# Patient Record
Sex: Male | Born: 1975 | Race: White | Hispanic: No | Smoking: Current every day smoker
Health system: Southern US, Community
[De-identification: ages and names within clinical notes are randomized; demographics above are authoritative.]

## PROBLEM LIST (undated history)

## (undated) ENCOUNTER — Emergency Department (HOSPITAL_COMMUNITY): Admission: EM | Payer: Self-pay | Source: Home / Self Care

## (undated) DIAGNOSIS — B1920 Unspecified viral hepatitis C without hepatic coma: Secondary | ICD-10-CM

## (undated) DIAGNOSIS — I219 Acute myocardial infarction, unspecified: Secondary | ICD-10-CM

## (undated) DIAGNOSIS — N200 Calculus of kidney: Secondary | ICD-10-CM

## (undated) DIAGNOSIS — I421 Obstructive hypertrophic cardiomyopathy: Secondary | ICD-10-CM

## (undated) DIAGNOSIS — N183 Chronic kidney disease, stage 3 unspecified: Secondary | ICD-10-CM

## (undated) DIAGNOSIS — I7101 Dissection of ascending aorta: Secondary | ICD-10-CM

## (undated) DIAGNOSIS — K5010 Crohn's disease of large intestine without complications: Secondary | ICD-10-CM

## (undated) DIAGNOSIS — I10 Essential (primary) hypertension: Secondary | ICD-10-CM

## (undated) DIAGNOSIS — F1410 Cocaine abuse, uncomplicated: Secondary | ICD-10-CM

## (undated) HISTORY — PX: SMALL INTESTINE SURGERY: SHX150

## (undated) HISTORY — PX: LITHOTRIPSY: SUR834

---

## 1998-09-15 HISTORY — PX: RENAL BIOPSY: SHX156

## 2006-03-14 ENCOUNTER — Inpatient Hospital Stay (HOSPITAL_COMMUNITY): Admission: EM | Admit: 2006-03-14 | Discharge: 2006-03-24 | Payer: Self-pay | Admitting: *Deleted

## 2009-09-15 HISTORY — PX: OTHER SURGICAL HISTORY: SHX169

## 2009-09-15 HISTORY — PX: CORONARY ANGIOPLASTY WITH STENT PLACEMENT: SHX49

## 2009-09-15 HISTORY — PX: CARDIAC CATHETERIZATION: SHX172

## 2012-01-17 ENCOUNTER — Emergency Department (HOSPITAL_COMMUNITY): Payer: Self-pay

## 2012-01-17 ENCOUNTER — Inpatient Hospital Stay (HOSPITAL_COMMUNITY)
Admission: EM | Admit: 2012-01-17 | Discharge: 2012-01-18 | DRG: 392 | Discharge status: Against Medical Advice | Payer: Self-pay | Attending: Family Medicine | Admitting: Family Medicine

## 2012-01-17 ENCOUNTER — Encounter (HOSPITAL_COMMUNITY): Payer: Self-pay | Admitting: Emergency Medicine

## 2012-01-17 DIAGNOSIS — R197 Diarrhea, unspecified: Secondary | ICD-10-CM | POA: Diagnosis present

## 2012-01-17 DIAGNOSIS — I10 Essential (primary) hypertension: Secondary | ICD-10-CM

## 2012-01-17 DIAGNOSIS — R1011 Right upper quadrant pain: Principal | ICD-10-CM | POA: Diagnosis present

## 2012-01-17 DIAGNOSIS — R109 Unspecified abdominal pain: Secondary | ICD-10-CM

## 2012-01-17 DIAGNOSIS — I2510 Atherosclerotic heart disease of native coronary artery without angina pectoris: Secondary | ICD-10-CM | POA: Insufficient documentation

## 2012-01-17 DIAGNOSIS — B86 Scabies: Secondary | ICD-10-CM | POA: Diagnosis present

## 2012-01-17 DIAGNOSIS — K5010 Crohn's disease of large intestine without complications: Secondary | ICD-10-CM | POA: Diagnosis present

## 2012-01-17 DIAGNOSIS — F1410 Cocaine abuse, uncomplicated: Secondary | ICD-10-CM

## 2012-01-17 DIAGNOSIS — I252 Old myocardial infarction: Secondary | ICD-10-CM

## 2012-01-17 DIAGNOSIS — A09 Infectious gastroenteritis and colitis, unspecified: Secondary | ICD-10-CM

## 2012-01-17 DIAGNOSIS — N19 Unspecified kidney failure: Secondary | ICD-10-CM

## 2012-01-17 DIAGNOSIS — R319 Hematuria, unspecified: Secondary | ICD-10-CM | POA: Diagnosis present

## 2012-01-17 DIAGNOSIS — F17200 Nicotine dependence, unspecified, uncomplicated: Secondary | ICD-10-CM | POA: Diagnosis present

## 2012-01-17 HISTORY — DX: Essential (primary) hypertension: I10

## 2012-01-17 HISTORY — DX: Acute myocardial infarction, unspecified: I21.9

## 2012-01-17 HISTORY — DX: Cocaine abuse, uncomplicated: F14.10

## 2012-01-17 HISTORY — DX: Crohn's disease of large intestine without complications: K50.10

## 2012-01-17 HISTORY — DX: Calculus of kidney: N20.0

## 2012-01-17 LAB — POCT I-STAT, CHEM 8
BUN: 29 mg/dL — ABNORMAL HIGH (ref 6–23)
Calcium, Ion: 1.2 mmol/L (ref 1.12–1.32)
Chloride: 111 mEq/L (ref 96–112)
Creatinine, Ser: 2.1 mg/dL — ABNORMAL HIGH (ref 0.50–1.35)
Glucose, Bld: 65 mg/dL — ABNORMAL LOW (ref 70–99)
HCT: 39 % (ref 39.0–52.0)
Hemoglobin: 13.3 g/dL (ref 13.0–17.0)
Potassium: 4.2 mEq/L (ref 3.5–5.1)
Sodium: 142 mEq/L (ref 135–145)
TCO2: 20 mmol/L (ref 0–100)

## 2012-01-17 LAB — URINALYSIS, ROUTINE W REFLEX MICROSCOPIC
Bilirubin Urine: NEGATIVE
Glucose, UA: NEGATIVE mg/dL
Ketones, ur: NEGATIVE mg/dL
Leukocytes, UA: NEGATIVE
Nitrite: NEGATIVE
Protein, ur: 30 mg/dL — AB
Specific Gravity, Urine: 1.022 (ref 1.005–1.030)
Urobilinogen, UA: 1 mg/dL (ref 0.0–1.0)
pH: 5.5 (ref 5.0–8.0)

## 2012-01-17 LAB — CBC
HCT: 38.7 % — ABNORMAL LOW (ref 39.0–52.0)
Hemoglobin: 12.6 g/dL — ABNORMAL LOW (ref 13.0–17.0)
MCH: 26.1 pg (ref 26.0–34.0)
MCHC: 32.6 g/dL (ref 30.0–36.0)
MCV: 80.3 fL (ref 78.0–100.0)
Platelets: 170 10*3/uL (ref 150–400)
RBC: 4.82 MIL/uL (ref 4.22–5.81)
RDW: 18.4 % — ABNORMAL HIGH (ref 11.5–15.5)
WBC: 6.1 10*3/uL (ref 4.0–10.5)

## 2012-01-17 LAB — URINE MICROSCOPIC-ADD ON

## 2012-01-17 MED ORDER — SODIUM CHLORIDE 0.9 % IV BOLUS (SEPSIS)
1000.0000 mL | Freq: Once | INTRAVENOUS | Status: AC
  Administered 2012-01-17: 1000 mL via INTRAVENOUS

## 2012-01-17 MED ORDER — KETOROLAC TROMETHAMINE 15 MG/ML IJ SOLN
15.0000 mg | INTRAMUSCULAR | Status: AC
  Administered 2012-01-17: 15 mg via INTRAVENOUS
  Filled 2012-01-17: qty 1

## 2012-01-17 MED ORDER — HYDROMORPHONE HCL PF 1 MG/ML IJ SOLN
1.0000 mg | Freq: Once | INTRAMUSCULAR | Status: AC
  Administered 2012-01-17: 1 mg via INTRAVENOUS
  Filled 2012-01-17: qty 1

## 2012-01-17 MED ORDER — ONDANSETRON HCL 4 MG/2ML IJ SOLN
4.0000 mg | Freq: Once | INTRAMUSCULAR | Status: AC
  Administered 2012-01-17: 4 mg via INTRAVENOUS
  Filled 2012-01-17: qty 2

## 2012-01-17 NOTE — ED Notes (Addendum)
Patient complaining of nausea and right lower abdominal pain since this morning; pain stretches around right side and to right flank area.  Patient reports history of kidney stones (last incident two years ago); patient states that these symptoms feel the same.  Patient reports burning during urination; denies hematuria.

## 2012-01-17 NOTE — ED Provider Notes (Signed)
History    36 year old male with abdominal pain. Gradual onset this morning. Patient Blake Gonzalez on the couch during onset. Pain is in the right flank and right lower quadrant. Radiation to his back. Worse with movement. Somewhat better with tensing of his abdominal muscles. No urinary complaints. No fevers or chills. Mild nausea. No vomiting. Patient reports a history kidney stones. Last one was many years ago he's not sure if current symptoms feel similar. CSN: [id number]  Arrival date & time 01/17/12  2027   First MD Initiated Contact with Patient 01/17/12 2044      Chief Complaint  Patient presents with  . Abdominal Pain    (Consider location/radiation/quality/duration/timing/severity/associated sxs/prior treatment) HPI  Past Medical History  Diagnosis Date  . Hypertension   . Kidney stones   . Myocardial infarction     Past Surgical History  Procedure Date  . Lithotripsy   . Cardiac catheterization   . Coronary stent placement     History reviewed. No pertinent family history.  History  Substance Use Topics  . Smoking status: Current Everyday Smoker -- 0.5 packs/day  . Smokeless tobacco: Not on file  . Alcohol Use: No      Review of Systems   Review of symptoms negative unless otherwise noted in HPI.   Allergies  Review of patient's allergies indicates no known allergies.  Home Medications   Current Outpatient Rx  Name Route Sig Dispense Refill  . ASPIRIN 325 MG PO TABS Oral Take 325 mg by mouth daily.    Marland Kitchen CLONIDINE HCL 0.1 MG PO TABS Oral Take 0.1 mg by mouth 2 (two) times daily.    . FUROSEMIDE 20 MG PO TABS Oral Take 20 mg by mouth daily.    Marland Kitchen LISINOPRIL 20 MG PO TABS Oral Take 20 mg by mouth daily.    Marland Kitchen PRESCRIPTION MEDICATION Oral Take 1 tablet by mouth 2 (two) times daily. Patient stated he takes Caduet 5mg . He was unsure of the atorvastatin strength      BP 170/115  Pulse 90  Temp(Src) 98.3 F (36.8 C) (Oral)  Resp 20  SpO2 97%  Physical  Exam  Nursing note and vitals reviewed. Constitutional: He appears well-developed and well-nourished. No distress.  HENT:  Head: Normocephalic and atraumatic.  Eyes: Conjunctivae are normal. Right eye exhibits no discharge. Left eye exhibits no discharge.  Neck: Neck supple.  Cardiovascular: Normal rate, regular rhythm and normal heart sounds.  Exam reveals no gallop and no friction rub.   No murmur heard. Pulmonary/Chest: Effort normal and breath sounds normal. No respiratory distress.  Abdominal: Soft. He exhibits no distension and no mass. There is tenderness. There is no rebound and no guarding.       Well healed midline surgical scar. Mild R sided abdominal tenderness.  Musculoskeletal: He exhibits no edema and no tenderness.       R cva tenderness  Neurological: He is alert.  Skin: Skin is warm and dry. He is not diaphoretic.  Psychiatric: He has a normal mood and affect. His behavior is normal. Thought content normal.    ED Course  Procedures (including critical care time)  Labs Reviewed  URINALYSIS, ROUTINE W REFLEX MICROSCOPIC - Abnormal; Notable for the following:    Hgb urine dipstick LARGE (*)    Protein, ur 30 (*)    All other components within normal limits  POCT I-STAT, CHEM 8 - Abnormal; Notable for the following:    BUN 29 (*)    Creatinine,  Ser 2.10 (*)    Glucose, Bld 65 (*)    All other components within normal limits  URINE MICROSCOPIC-ADD ON - Abnormal; Notable for the following:    Bacteria, UA FEW (*)    All other components within normal limits  CBC - Abnormal; Notable for the following:    Hemoglobin 12.6 (*)    HCT 38.7 (*)    RDW 18.4 (*)    All other components within normal limits   Ct Abdomen Pelvis Wo Contrast  01/17/2012  *RADIOLOGY REPORT*  Clinical Data: 36 year old male with right-side abdominal pain. History of small bowel resection.  CT ABDOMEN AND PELVIS WITHOUT CONTRAST  Technique:  Multidetector CT imaging of the abdomen and pelvis was  performed following the standard protocol without intravenous contrast.  Comparison: 03/14/2006.  Findings: Cardiomegaly.  No pleural effusion.  There are multiple lower lobe pulmonary nodules, more numerous on the right.  These range in size from 4-9 mm.  None are clearly identified on the comparison.  Stable visualized osseous structures.  A small volume of pelvic free fluid.  No dilated bowel in the abdomen, but individual loops are not well delineated, this appears in part related to a degree of generalized mesenteric haziness. The bladder appears completely decompressed.  Postoperative changes at the cecum.  Probable neo-terminal ileum (series 2 image 59).  Stable chronic splenomegaly.  No abnormality of the pancreas or adrenal glands evident in the absence of contrast.  An exophytic left renal lesion described in 2007 with some atypical features now shows dystrophic type calcification (series 2 image 37.  The noncontrast left kidney otherwise appears stable within normal limits.  The right kidney appears stable within normal limits.  No retroperitoneal lymphadenopathy is evident.  Interestingly, small low density area in the posterior right hepatic lobe on the prior exam also now with calcified (series 2 image 17).  Elsewhere the noncontrast liver parenchyma is within normal limits.  Gallbladder decompressed.  Abdominal free fluid.  IMPRESSION: 1.  New suspicious lung nodules at both lung bases, more numerous on the right.  These are not typical of acute inflammation.  They might be post inflammatory, but none were present in 2007.  In conjunction with #2, early pulmonary metastatic disease cannot be excluded. 2.  Interval dystrophic calcification of the exophytic left renal lesion which demonstrated a typical features in 2007.  A small right hepatic lesion present at that time also has calcified.  Was the patient treated for a malignancy? 3.  No dilated bowel.  Postoperative changes in the right lower  quadrant.  Small volume of nonspecific pelvic free fluid. 4. Cardiomegaly.  Original Report Authenticated By: Harley Hallmark, M.D.     1. Renal failure   2. Abdominal pain    MDM  35yM with abdominal pain. Renal failure. Mentioned in ER note from 2007 but not clear if acute, if resolved, or what labs values were then. Pt on loop diuretic and ACEI which he reports compliance with. Apparently gets filled on occasional ER visits. Symptoms consistent with ureteral colic and UA with hematuria but no stone on CT. Small amount of free fluid in pelvis of uncertain origin. Symptoms possibly from inflammatory bowel disease which pt apparently has history of. Pt told me ulcerative colitis but PMHx lists Crohn's. CT incidentally with multiple pulmonary nodules. Pt smoker. Other findings concerning for possible malignancy. Pt with no reliable follow-up. Given renal failure of unclear chronicity, no follow-up and CT findings, will admit for further eval.  Raeford Razor, MD 01/18/12 [phone]

## 2012-01-17 NOTE — H&P (Signed)
Family Medicine Teaching Service HISTORY & PHYSICAL   Patient name: Blake Gonzalez Medical record number: [id number] Date of birth: 1976/04/12 Age: 36 y.o. Gender: male  Primary Care Provider: Unassigned   Chief Complaint: Abdominal Pain    HPI  Blake Gonzalez is a 36 y.o. year old male presenting with 1 day history of abdominal pain. Location  R LQ abdominal pain  Onset  today  Character  sharp; radiates to back and mid epigastrium;  Severity  8/10  Alleviating  pressure on the belly; tylenol this AM; 1/2 Lortab  Aggrivating  Moving   Associated with Diarrhea, chills  Pt with scattered medical care over the past with care received in 6-7 states.   ROS   Constitutional No wt loss; good appetite;  Infectious No fevers, + Chills, starting today  Resp No cough, no congestion  Cardiac Hx of MI; but no chest pain or palpitations  GI No nausea or vomiting;   GU Burning while urinating, no blood in urine, no hesistancy  Skin Rash, that itches, on feet; used calamine, benadryl, cortisone cream  Neuro Feet feeling numb  No history of asbestos, silca or other chemical exposure history            HISTORY:  PMHx:  Past Medical History  Diagnosis Date  . Hypertension     Multiple Hospitalizations  . Kidney stones   . Myocardial infarction     In [place], [place]  . Crohn's colitis   . Cocaine abuse     Started using after divorce; no use around time of MI    PSHx: Past Surgical History  Procedure Date  . Lithotripsy   . Cardiac catheterization 2011  . Coronary angioplasty with stent placement 2011  . Small intestine surgery   . Renal biopsy 2000  . Other surgical history 2011    Percutaneous biliary tube    Social Hx: History   Social History  . Marital Status: Single    Spouse Name: N/A    Number of Children: N/A  . Years of Education: N/A   Social History Main Topics  . Smoking status: Current Everyday Smoker -- 0.5 packs/day    Types: Cigarettes  . Smokeless  tobacco: None  . Alcohol Use: No  . Drug Use: No     Hx of Cocaine Abuse; Clean 6-7 months  . Sexually Active: None   Other Topics Concern  . None   Social History Narrative   DivorcedLives with Girlfriend in [place] and [place] yo and 2 yo Sons    Substance Hx: History  Substance Use Topics  . Smoking status: Current Everyday Smoker -- 0.5 packs/day    Types: Cigarettes  . Smokeless tobacco: Not on file  . Alcohol Use: No    Family Hx: Family History  Problem Relation Age of Onset  . Heart disease Father     early death at age 64  . Diabetes Maternal Aunt   . Cancer      Allergies: No Known Allergies  Home Medications: Prior to Admission medications   Medication Sig Start Date End Date Taking? Authorizing Provider  aspirin 325 MG tablet Take 325 mg by mouth daily.   Yes Historical Provider, MD  cloNIDine (CATAPRES) 0.1 MG tablet Take 0.1 mg by mouth 2 (two) times daily.   Yes Historical Provider, MD  furosemide (LASIX) 20 MG tablet Take 20 mg by mouth daily.   Yes Historical Provider, MD  lisinopril (PRINIVIL,ZESTRIL) 20 MG tablet Take  20 mg by mouth daily.   Yes Historical Provider, MD  PRESCRIPTION MEDICATION Take 1 tablet by mouth 2 (two) times daily. Patient stated he takes Caduet 5mg . He was unsure of the atorvastatin strength   Yes Historical Provider, MD              OBJECTIVE  Vitals: Patient Vitals for the past 24 hrs:  BP Temp Temp src Pulse Resp SpO2  01/17/12 2350 164/92 mmHg 97 F (36.1 C) Oral 66  18  97 %  01/17/12 2032 170/115 mmHg 98.3 F (36.8 C) Oral 90  20  97 %   Wt Readings from Last 3 Encounters:  No data found for Wt   No intake or output data in the 24 hours ending 01/18/12 0015  PE: GENERAL:  Adult Caucasian male.  Examined in [facility] ED.  Laying in hospital stretcher  In moderate discomfort; norespiratory distress.   PSYCH: Alert and appropriately interactive; Insight:Good   H&N: NECK: supple, no adenopathy, trachea  midline and no carotid bruits ENT+Mouth: normal TM's and external ear canals both earsabnormal findings: dentition: multiple carries and periodontitis THORAX: HEART: RRR, S1/S2 heard, no murmur LUNGS: CTA B, no wheezes, no crackles ABDOMEN:  +hyperactive BS, soft, tenderness in RLQ; no rigidity, voluntary guarding; no rebound, no masses/organomegaly EXTREMITIES: Moves all 4 extremities spontaneously, warm well perfused, no edema, bilateral DP and PT pulses 3+/4.   SKIN:  Multiple areas of red excoriated lesions mainly located over the feet and ankles but also on back, arms and legs. Scattered on back  LABS:  Basename 01/17/12 2205 01/17/12 2116  WBC 6.1 --  HGB 12.6* 13.3  HCT 38.7* 39.0  PLT 170 --    Basename 01/17/12 2116  NA 142  K 4.2  CL 111  CO2 --  BUN 29*  CREATININE 2.10*  CALCIUM --  GLUCOSE 65*    Basename 01/17/12 2205  RDW 18.4*  MCV 80.3  MCHC 32.6  MRET --    Basename 01/17/12 2116  PHART --  PCO2ART --  PO2ART --  HCO3 --  TCO2 20  ACIDBASEDEF --  O2SAT --    Basename 01/17/12 2116  PTH --  CAION 1.20    MICRO: Urinalysis  Basename 01/17/12 2050  COLORURINE YELLOW  APPEARANCEUR CLEAR  LABSPEC 1.022  PHURINE 5.5  GLUCOSEU NEGATIVE  HGBUR LARGE*  BILIRUBINUR NEGATIVE  KETONESUR NEGATIVE  PROTEINUR 30*  UROBILINOGEN 1.0  NITRITE NEGATIVE  LEUKOCYTESUR NEGATIVE   IMAGING: Ct Abdomen Pelvis Wo Contrast  01/17/2012  *RADIOLOGY REPORT*  Clinical Data: 36 year old male with right-side abdominal pain. History of small bowel resection.  CT ABDOMEN AND PELVIS WITHOUT CONTRAST  Technique:  Multidetector CT imaging of the abdomen and pelvis was performed following the standard protocol without intravenous contrast.  Comparison: 03/14/2006.  Findings: Cardiomegaly.  No pleural effusion.  There are multiple lower lobe pulmonary nodules, more numerous on the right.  These range in size from 4-9 mm.  None are clearly identified on the comparison.   Stable visualized osseous structures.  A small volume of pelvic free fluid.  No dilated bowel in the abdomen, but individual loops are not well delineated, this appears in part related to a degree of generalized mesenteric haziness. The bladder appears completely decompressed.  Postoperative changes at the cecum.  Probable neo-terminal ileum (series 2 image 59).  Stable chronic splenomegaly.  No abnormality of the pancreas or adrenal glands evident in the absence of contrast.  An exophytic left renal lesion  described in 2007 with some atypical features now shows dystrophic type calcification (series 2 image 37.  The noncontrast left kidney otherwise appears stable within normal limits.  The right kidney appears stable within normal limits.  No retroperitoneal lymphadenopathy is evident.  Interestingly, small low density area in the posterior right hepatic lobe on the prior exam also now with calcified (series 2 image 17).  Elsewhere the noncontrast liver parenchyma is within normal limits.  Gallbladder decompressed.  Abdominal free fluid.  IMPRESSION: 1.  New suspicious lung nodules at both lung bases, more numerous on the right.  These are not typical of acute inflammation.  They might be post inflammatory, but none were present in 2007.  In conjunction with #2, early pulmonary metastatic disease cannot be excluded. 2.  Interval dystrophic calcification of the exophytic left renal lesion which demonstrated a typical features in 2007.  A small right hepatic lesion present at that time also has calcified.  Was the patient treated for a malignancy? 3.  No dilated bowel.  Postoperative changes in the right lower quadrant.  Small volume of nonspecific pelvic free fluid. 4. Cardiomegaly.  Original Report Authenticated By: Harley Hallmark, M.D.             ASSESSMENT & PLAN  36 y.o. year old male with acute onset of abdominal pain with no CT evidence of etiology; however found to have masses in liver, kidney and  pulmonary nodules.  1.  Abdominal Pain - pt with acute onset of RLQ pain; s/p small bowel resection for ?crohn's with no evidence of colitis or appendicitis on CT scan but less than ideal study due to non-contrast.   RLQ Abdominal pain Nausea Loose Stools Crohn's Disease ----------------- *Oxycodone IR *Zofran - Consider colitis given small amount of fluid in the pelvis - Discussed cased with Radiologist who was unable to visualize appendix likely due to prior surgical resection although pt unaware - no evidence of acute infection with normal white count; afebrile - history of cholecystitis with bilary drain; still has gallbladder but no evidence of acute cholecystitis on exam or imaging  - Serial Abdominal Exams  2. SKIN - pt with multiple lesions with concerns for scabies will treat Scabies ------------------- *Permetherin - Will treat for scabies   3. RENAL - Cr elevated on labs today; unknown if acute/chronic/long standing.  Hx of renal biopsy with ? dystophic calcification.  History of kidney stones but no evidence of CT today.  + Hematuria L Renal dystrophic calcification Hematuria ------------------- IVFs - Holding nephrotoxic agents - Consider scar tissue vs renal tumor vs renal mets  [ ]  FeNa  4.  PULMONARY - Pt with no pulmonary complaints today but on CT scan of Abdomen/Pelvis found to have multiple scattered pulmonary nodules.  Denies symptoms at this time Pulmonary Nodules Tobacco Use ---------------------  -No NRT required at 5/day - Concern for pulmonary metastisis.  Will consider further workup while in hospital but will discuss with team for further imaging.  Will likely need CT chest. - Pt smoking 5 ciggs / day but with history of 1 ppd until recently.      5. CV - pt with history of MI at age 14 and HTN.  Will  No complaints of chest pain today.  Will hold nephrotoxic BP agents and increase  amlodipine CAD Cardiomegaly HTN -------------------- *ASA *clonidine *amlodipine *hydralazine prn - pt with significant history of early MI;  - + hx of cocaine abuse but denies any use during time  of MI.  Has been off cocaine for 7+ months; will continue to avoid B-blocker at this time - Will risk stratify but otherwise no cardiac symptoms - No history of CHF but will monitor fluid status closely; Consider ECHO as OP and referral to Cardiology if any decompensation  [ ]  TSH [ ]  A1C [ ]  Lipid Panel   6.  ENDOCRINE - Will risk stratify as above and continue home meds HLD Liver Mass ----------------------- *Lipitor - No known diabetes - on lipitor as home med - liver masses on CT - see above Pulm section   7.  Substance Abuse - hx of cocaine and IV drug use Hx of Cocaine abuse Tobacco use --------------------- Avoid BB - Will check HIV and hepatitis; no active concerns but given extent of renal, pulmonary and  Liver masses consider HIV as masses not consistent at this point with any primary neoplasm   --- FEN  *NS -Clears will advance --- PPx: heparin             DISPOSITION  Pt will be placed in Med/surg with serial abdominal exams.  Likely secondary to adhesions from prior surgery but will have low threshold for surgical consult if worsening given severity of pain.   Will need further workup for ?malignancy and to establish PCP.  Andrena Mews, DO [facility] Resident - PGY-1 01/18/2012 12:15 AM  R3 Addendum PE BP 164/92  Pulse 66  Temp(Src) 97 F (36.1 C) (Oral)  Resp 18  SpO2 97%  GEN- mildly uncomfortable, tall, thin white man HEENT-dry mucous membranes, poor dentition, no sores in mouth RESP- CTAB CARDS- RRR, no murmur ABD- ND, BS active, tender to palpation in RLQ, suprapubic.  LLQ radiates to RLQ EXT- thin, no edema Skin- multiple red, excoriated lesions that cluster around the feet and ankles but are also on back, arms, hands  A/P 36  y/o white male with abd pain and hematuria. 1. Abd pain- hx of presumed chron's disease and RLQ, suprapubic, and LLQ pain.  CT scans shows no appendix and no definite colitis.  No sign of UTI on U/A but will send for culture.  No elevation in WBC or fevers, so no abx at this time.  Will perform serial exams.  Plan to treat with oxycodone for pain relief and start clear liquid diet.   2. 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  May be the cause of his hematuria.  H and H normal.  Will consult urology for advice on how to proceed. 3. Elevated creatinine- unsure if acute or chronic.  Will check FeNa and give IVF since pt is dehydrated on exam.  Will follow serial Cr.  Stopped lisinopril for now since unsure if acute injury. 4. Pulmonary nodules- unclear etiology.  Will consider CT chest for further evaluation.  Also, will check an HIV.  No cough or respiratory distress.  Per radiology, this is not a common pattern for infectious etiology.   5. Rash- likely scabies.  Will treat with permethrin 5%. 6. Cardiac enlargement- 13 year hx of HTN, consider ECHO as outpt. 7. HTN- continue home meds of amlodipine, clonidine.  Hold Lasix and lisinopril. 8. PPX- heparin 5000 units SQ TID 9. Dispo- Pending further w/u and improvement in pain symptom Kyuss Hale

## 2012-01-18 DIAGNOSIS — F1410 Cocaine abuse, uncomplicated: Secondary | ICD-10-CM | POA: Insufficient documentation

## 2012-01-18 LAB — HEMOGLOBIN A1C
Hgb A1c MFr Bld: 6 % — ABNORMAL HIGH (ref <5.7)
Mean Plasma Glucose: 126 mg/dL — ABNORMAL HIGH (ref <117)

## 2012-01-18 LAB — LIPID PANEL
Cholesterol: 107 mg/dL (ref 0–200)
HDL: 34 mg/dL — ABNORMAL LOW (ref >39)
LDL Cholesterol: 61 mg/dL (ref 0–99)
Total CHOL/HDL Ratio: 3.1 RATIO
Triglycerides: 61 mg/dL (ref <150)
VLDL: 12 mg/dL (ref 0–40)

## 2012-01-18 LAB — CREATININE, SERUM
Creatinine, Ser: 2.07 mg/dL — ABNORMAL HIGH (ref 0.50–1.35)
GFR calc Af Amer: 45 mL/min — ABNORMAL LOW (ref >90)
GFR calc non Af Amer: 39 mL/min — ABNORMAL LOW (ref >90)

## 2012-01-18 LAB — CBC
HCT: 35.7 % — ABNORMAL LOW (ref 39.0–52.0)
HCT: 36.2 % — ABNORMAL LOW (ref 39.0–52.0)
Hemoglobin: 11.4 g/dL — ABNORMAL LOW (ref 13.0–17.0)
Hemoglobin: 11.8 g/dL — ABNORMAL LOW (ref 13.0–17.0)
MCH: 25.7 pg — ABNORMAL LOW (ref 26.0–34.0)
MCH: 26.5 pg (ref 26.0–34.0)
MCHC: 31.9 g/dL (ref 30.0–36.0)
MCHC: 32.6 g/dL (ref 30.0–36.0)
MCV: 80.4 fL (ref 78.0–100.0)
MCV: 81.2 fL (ref 78.0–100.0)
Platelets: 134 10*3/uL — ABNORMAL LOW (ref 150–400)
Platelets: 151 10*3/uL (ref 150–400)
RBC: 4.44 MIL/uL (ref 4.22–5.81)
RBC: 4.46 MIL/uL (ref 4.22–5.81)
RDW: 18.4 % — ABNORMAL HIGH (ref 11.5–15.5)
RDW: 18.4 % — ABNORMAL HIGH (ref 11.5–15.5)
WBC: 4.5 10*3/uL (ref 4.0–10.5)
WBC: 4 10*3/uL (ref 4.0–10.5)

## 2012-01-18 LAB — TSH: TSH: 0.771 u[IU]/mL (ref 0.350–4.500)

## 2012-01-18 LAB — HIV ANTIBODY (ROUTINE TESTING W REFLEX): HIV: NONREACTIVE

## 2012-01-18 LAB — COMPREHENSIVE METABOLIC PANEL
ALT: 31 U/L (ref 0–53)
AST: 30 U/L (ref 0–37)
Albumin: 2.8 g/dL — ABNORMAL LOW (ref 3.5–5.2)
Alkaline Phosphatase: 87 U/L (ref 39–117)
BUN: 25 mg/dL — ABNORMAL HIGH (ref 6–23)
CO2: 21 mEq/L (ref 19–32)
Calcium: 8.1 mg/dL — ABNORMAL LOW (ref 8.4–10.5)
Chloride: 110 mEq/L (ref 96–112)
Creatinine, Ser: 2.08 mg/dL — ABNORMAL HIGH (ref 0.50–1.35)
GFR calc Af Amer: 45 mL/min — ABNORMAL LOW (ref >90)
GFR calc non Af Amer: 39 mL/min — ABNORMAL LOW (ref >90)
Glucose, Bld: 95 mg/dL (ref 70–99)
Potassium: 4.1 mEq/L (ref 3.5–5.1)
Sodium: 140 mEq/L (ref 135–145)
Total Bilirubin: 0.3 mg/dL (ref 0.3–1.2)
Total Protein: 5.3 g/dL — ABNORMAL LOW (ref 6.0–8.3)

## 2012-01-18 MED ORDER — HYDRALAZINE HCL 20 MG/ML IJ SOLN: 5.0000 mg | INTRAMUSCULAR | Status: DC | PRN

## 2012-01-18 MED ORDER — CLONIDINE HCL 0.1 MG PO TABS
0.1000 mg | ORAL_TABLET | Freq: Two times a day (BID) | ORAL | Status: DC
  Filled 2012-01-18 (×2): qty 1

## 2012-01-18 MED ORDER — HEPARIN SODIUM (PORCINE) 5000 UNIT/ML IJ SOLN
5000.0000 [IU] | Freq: Three times a day (TID) | INTRAMUSCULAR | Status: DC
  Administered 2012-01-18: 5000 [IU] via SUBCUTANEOUS
  Filled 2012-01-18 (×4): qty 1

## 2012-01-18 MED ORDER — AMLODIPINE BESYLATE 10 MG PO TABS
10.0000 mg | ORAL_TABLET | Freq: Every day | ORAL | Status: DC
  Administered 2012-01-18: 10 mg via ORAL
  Filled 2012-01-18: qty 1

## 2012-01-18 MED ORDER — PERMETHRIN 5 % EX CREA
TOPICAL_CREAM | CUTANEOUS | Status: AC
  Administered 2012-01-18: 01:00:00 via TOPICAL
  Filled 2012-01-18: qty 60

## 2012-01-18 MED ORDER — OXYCODONE HCL 5 MG PO TABS
5.0000 mg | ORAL_TABLET | ORAL | Status: DC | PRN
  Administered 2012-01-18 (×2): 5 mg via ORAL
  Filled 2012-01-18 (×2): qty 1

## 2012-01-18 MED ORDER — SODIUM CHLORIDE 0.9 % IV SOLN
INTRAVENOUS | Status: DC
  Administered 2012-01-18: 01:00:00 via INTRAVENOUS

## 2012-01-18 MED ORDER — ASPIRIN EC 81 MG PO TBEC
81.0000 mg | DELAYED_RELEASE_TABLET | Freq: Every day | ORAL | Status: DC
  Filled 2012-01-18: qty 1

## 2012-01-18 MED ORDER — ONDANSETRON HCL 4 MG/2ML IJ SOLN: 4.0000 mg | Freq: Four times a day (QID) | INTRAMUSCULAR | Status: DC | PRN

## 2012-01-18 MED ORDER — ATORVASTATIN CALCIUM 20 MG PO TABS
20.0000 mg | ORAL_TABLET | Freq: Every day | ORAL | Status: DC
  Filled 2012-01-18: qty 1

## 2012-01-18 MED ORDER — ONDANSETRON HCL 4 MG PO TABS: 4.0000 mg | ORAL_TABLET | Freq: Four times a day (QID) | ORAL | Status: DC | PRN

## 2012-01-18 NOTE — ED Notes (Signed)
Attempted to give report to Zella Ball, Charity fundraiser.  Will call back in 15 min

## 2012-01-18 NOTE — H&P (Signed)
FMTS Attending Admission Note: Blake Levy MD [phone] pager office [phone] I  have  Reviewed this patients chart. He was admitted by Dr Hulen Luster and team during the night. He left, apparently AMA, before my Am rounds. He left a note saying he could not stay in hospital because he had a new job starting  When the AM resident went to see him this morning he was gone, his IV was removed and left (still running by the report I received) in his bed. Nursing was unaware of his departure.. I did not see him. We have notified risk management. I have no  reason to doubt his mental competence, although his judgement may be under some question given the findings on his CT scan suggestive of possible metastatic process. Thee was nothing in his admission that made me concerned for suicidal or homicidal ideation.

## 2012-01-18 NOTE — Progress Notes (Signed)
Family Medicine Progress Note  Pt left AMA- left without notifying nursing or medical staff.  When I went to see patient this morning he was not in room.  Staff did not see him leave.  On further inspection of the room, IV cath found removed from hand and left at beside, and a note stating that he was starting a new job tomorrow and had to leave.     LOS: 1 day   Blake Gonzalez 01/18/2012, 9:37 AM  Blake Gonzalez S. Edmonia James, MD Family Medicine Residency Program PGY-3 [phone]

## 2012-01-18 NOTE — Progress Notes (Signed)
No urination during the night. Pt reports he did urinate in the ER last night.

## 2012-01-18 NOTE — Progress Notes (Signed)
Patient left AMA this am, MD notified.  Patient left note in room that he was leaving for a job, he was starting in the AM.  Saline locked removed by patient and was found in sink with cathlon intact.

## 2012-01-19 LAB — HEPATITIS PANEL, ACUTE
HCV Ab: REACTIVE — AB
Hep A IgM: NEGATIVE
Hep B C IgM: NEGATIVE
Hepatitis B Surface Ag: NEGATIVE

## 2012-01-24 ENCOUNTER — Inpatient Hospital Stay (HOSPITAL_COMMUNITY)
Admission: EM | Admit: 2012-01-24 | Discharge: 2012-01-27 | DRG: 392 | Disposition: A | Discharge status: Home / Self Care | Payer: Self-pay | Attending: Internal Medicine | Admitting: Internal Medicine

## 2012-01-24 ENCOUNTER — Encounter (HOSPITAL_COMMUNITY): Payer: Self-pay | Admitting: Physical Medicine and Rehabilitation

## 2012-01-24 ENCOUNTER — Emergency Department (HOSPITAL_COMMUNITY): Payer: Self-pay

## 2012-01-24 DIAGNOSIS — R1013 Epigastric pain: Secondary | ICD-10-CM

## 2012-01-24 DIAGNOSIS — Z87442 Personal history of urinary calculi: Secondary | ICD-10-CM

## 2012-01-24 DIAGNOSIS — N179 Acute kidney failure, unspecified: Secondary | ICD-10-CM

## 2012-01-24 DIAGNOSIS — I2510 Atherosclerotic heart disease of native coronary artery without angina pectoris: Secondary | ICD-10-CM | POA: Diagnosis present

## 2012-01-24 DIAGNOSIS — F17200 Nicotine dependence, unspecified, uncomplicated: Secondary | ICD-10-CM | POA: Diagnosis present

## 2012-01-24 DIAGNOSIS — F1410 Cocaine abuse, uncomplicated: Secondary | ICD-10-CM

## 2012-01-24 DIAGNOSIS — I10 Essential (primary) hypertension: Secondary | ICD-10-CM

## 2012-01-24 DIAGNOSIS — R1110 Vomiting, unspecified: Secondary | ICD-10-CM

## 2012-01-24 DIAGNOSIS — I252 Old myocardial infarction: Secondary | ICD-10-CM

## 2012-01-24 DIAGNOSIS — R112 Nausea with vomiting, unspecified: Secondary | ICD-10-CM | POA: Diagnosis present

## 2012-01-24 DIAGNOSIS — F1210 Cannabis abuse, uncomplicated: Secondary | ICD-10-CM | POA: Diagnosis present

## 2012-01-24 DIAGNOSIS — Z79899 Other long term (current) drug therapy: Secondary | ICD-10-CM

## 2012-01-24 DIAGNOSIS — R7989 Other specified abnormal findings of blood chemistry: Secondary | ICD-10-CM

## 2012-01-24 DIAGNOSIS — Z7982 Long term (current) use of aspirin: Secondary | ICD-10-CM

## 2012-01-24 DIAGNOSIS — B1920 Unspecified viral hepatitis C without hepatic coma: Secondary | ICD-10-CM | POA: Diagnosis present

## 2012-01-24 DIAGNOSIS — D696 Thrombocytopenia, unspecified: Secondary | ICD-10-CM

## 2012-01-24 DIAGNOSIS — N189 Chronic kidney disease, unspecified: Secondary | ICD-10-CM | POA: Diagnosis present

## 2012-01-24 DIAGNOSIS — K5010 Crohn's disease of large intestine without complications: Secondary | ICD-10-CM

## 2012-01-24 DIAGNOSIS — I219 Acute myocardial infarction, unspecified: Secondary | ICD-10-CM

## 2012-01-24 DIAGNOSIS — K2900 Acute gastritis without bleeding: Principal | ICD-10-CM

## 2012-01-24 DIAGNOSIS — I129 Hypertensive chronic kidney disease with stage 1 through stage 4 chronic kidney disease, or unspecified chronic kidney disease: Secondary | ICD-10-CM | POA: Diagnosis present

## 2012-01-24 DIAGNOSIS — R7401 Elevation of levels of liver transaminase levels: Secondary | ICD-10-CM

## 2012-01-24 DIAGNOSIS — Z9861 Coronary angioplasty status: Secondary | ICD-10-CM

## 2012-01-24 HISTORY — DX: Unspecified viral hepatitis C without hepatic coma: B19.20

## 2012-01-24 LAB — COMPREHENSIVE METABOLIC PANEL
Albumin: 3.1 g/dL — ABNORMAL LOW (ref 3.5–5.2)
BUN: 27 mg/dL — ABNORMAL HIGH (ref 6–23)
CO2: 16 mEq/L — ABNORMAL LOW (ref 19–32)
Calcium: 8.5 mg/dL (ref 8.4–10.5)
Chloride: 113 mEq/L — ABNORMAL HIGH (ref 96–112)
Creatinine, Ser: 2.12 mg/dL — ABNORMAL HIGH (ref 0.50–1.35)
GFR calc non Af Amer: 38 mL/min — ABNORMAL LOW (ref >90)
Total Bilirubin: 0.3 mg/dL (ref 0.3–1.2)

## 2012-01-24 LAB — CBC
HCT: 38.4 % — ABNORMAL LOW (ref 39.0–52.0)
Hemoglobin: 12.4 g/dL — ABNORMAL LOW (ref 13.0–17.0)
MCH: 26.2 pg (ref 26.0–34.0)
MCV: 81.2 fL (ref 78.0–100.0)
Platelets: 106 10*3/uL — ABNORMAL LOW (ref 150–400)
RBC: 4.73 MIL/uL (ref 4.22–5.81)
WBC: 4.5 10*3/uL (ref 4.0–10.5)

## 2012-01-24 LAB — DIFFERENTIAL
Basophils Relative: 0 % (ref 0–1)
Eosinophils Relative: 4 % (ref 0–5)
Lymphs Abs: 1.2 10*3/uL (ref 0.7–4.0)
Monocytes Absolute: 0.4 10*3/uL (ref 0.1–1.0)
Monocytes Relative: 8 % (ref 3–12)

## 2012-01-24 LAB — URINALYSIS, DIPSTICK ONLY
Nitrite: NEGATIVE
Specific Gravity, Urine: 1.015 (ref 1.005–1.030)
Urobilinogen, UA: 0.2 mg/dL (ref 0.0–1.0)

## 2012-01-24 LAB — LIPASE, BLOOD: Lipase: 61 U/L — ABNORMAL HIGH (ref 11–59)

## 2012-01-24 MED ORDER — SODIUM CHLORIDE 0.9 % IV SOLN
INTRAVENOUS | Status: DC
  Administered 2012-01-24 – 2012-01-26 (×3): via INTRAVENOUS

## 2012-01-24 MED ORDER — OXYCODONE HCL 5 MG PO TABS
5.0000 mg | ORAL_TABLET | ORAL | Status: DC | PRN
  Administered 2012-01-27 (×2): 5 mg via ORAL
  Filled 2012-01-24 (×2): qty 1

## 2012-01-24 MED ORDER — SODIUM CHLORIDE 0.9 % IV BOLUS (SEPSIS)
1000.0000 mL | INTRAVENOUS | Status: AC
  Administered 2012-01-24: 1000 mL via INTRAVENOUS

## 2012-01-24 MED ORDER — ONDANSETRON HCL 4 MG/2ML IJ SOLN
4.0000 mg | Freq: Once | INTRAMUSCULAR | Status: AC
  Administered 2012-01-24: 4 mg via INTRAVENOUS
  Filled 2012-01-24: qty 2

## 2012-01-24 MED ORDER — ZOLPIDEM TARTRATE 5 MG PO TABS
5.0000 mg | ORAL_TABLET | Freq: Every evening | ORAL | Status: DC | PRN
  Administered 2012-01-26: 5 mg via ORAL
  Filled 2012-01-24: qty 1

## 2012-01-24 MED ORDER — HYDROMORPHONE HCL PF 1 MG/ML IJ SOLN
1.0000 mg | INTRAMUSCULAR | Status: AC
  Administered 2012-01-24: 1 mg via INTRAVENOUS
  Filled 2012-01-24: qty 1

## 2012-01-24 MED ORDER — ONDANSETRON HCL 4 MG PO TABS: 4.0000 mg | ORAL_TABLET | Freq: Four times a day (QID) | ORAL | Status: DC | PRN

## 2012-01-24 MED ORDER — MORPHINE SULFATE 4 MG/ML IJ SOLN
4.0000 mg | INTRAMUSCULAR | Status: AC
Start: 2012-01-24 — End: 2012-01-24
  Administered 2012-01-24: 4 mg via INTRAVENOUS
  Filled 2012-01-24: qty 1

## 2012-01-24 MED ORDER — PANTOPRAZOLE SODIUM 40 MG IV SOLR
40.0000 mg | INTRAVENOUS | Status: DC
  Administered 2012-01-24 – 2012-01-25 (×2): 40 mg via INTRAVENOUS
  Filled 2012-01-24 (×3): qty 40

## 2012-01-24 MED ORDER — ENOXAPARIN SODIUM 40 MG/0.4ML ~~LOC~~ SOLN: 40.0000 mg | SUBCUTANEOUS | Status: DC

## 2012-01-24 MED ORDER — ACETAMINOPHEN 650 MG RE SUPP: 650.0000 mg | Freq: Four times a day (QID) | RECTAL | Status: DC | PRN

## 2012-01-24 MED ORDER — ONDANSETRON HCL 4 MG/2ML IJ SOLN: 4.0000 mg | Freq: Four times a day (QID) | INTRAMUSCULAR | Status: DC | PRN

## 2012-01-24 MED ORDER — HYDROMORPHONE HCL PF 1 MG/ML IJ SOLN
0.5000 mg | INTRAMUSCULAR | Status: DC | PRN
  Administered 2012-01-25 – 2012-01-27 (×13): 1 mg via INTRAVENOUS
  Filled 2012-01-24 (×13): qty 1

## 2012-01-24 MED ORDER — ACETAMINOPHEN 325 MG PO TABS: 650.0000 mg | ORAL_TABLET | Freq: Four times a day (QID) | ORAL | Status: DC | PRN

## 2012-01-24 MED ORDER — ENOXAPARIN SODIUM 40 MG/0.4ML ~~LOC~~ SOLN
40.0000 mg | SUBCUTANEOUS | Status: DC
  Administered 2012-01-25 – 2012-01-27 (×3): 40 mg via SUBCUTANEOUS
  Filled 2012-01-24 (×3): qty 0.4

## 2012-01-24 NOTE — ED Provider Notes (Addendum)
36 year old male has been having a upper abdominal pain with some radiation through the back. Pain is been present for the last 2 days. He had been hospitalized for similar pain last week and states that he never really got completely over it. There is an associated nausea and vomiting. He has been having normal bowel movements. There is a history of prior abdominal surgery. On exam, there is marked tenderness across the upper abdomen which is worse in the epigastric area. Bowel sounds are diminished and high-pitched worrisome for possible early obstruction or ileus. He he no prior records were reviewed and he had a CT scan of his recent admission which did not show any evidence of nephrolithiasis but no abnormality was seen of the liver. She he will be given IV fluids and IV antiemetics and IV narcotics. Workup was started including laboratory workup, abdominal plain films and abdominal ultrasound.  Dione Booze, MD 01/24/12 1607   Date: 01/24/2012  Rate: 77  Rhythm: normal sinus rhythm  QRS Axis: normal  Intervals: normal  ST/T Wave abnormalities: Inverted T waves inferiorly and anterolaterally, ST elevation V1 and V2, ST depression anterolaterally  Conduction Disutrbances:none  Narrative Interpretation: Left atrial hypertrophy, left ventricular hypertrophy, ST and T changes secondary to left ventricular hypertrophy. No old ECG available for comparison.  Old EKG Reviewed: none available    Dione Booze, MD 01/24/12 1630

## 2012-01-24 NOTE — ED Provider Notes (Signed)
History     CSN: [id number]  Arrival date & time 01/24/12  1533   None     Chief Complaint  Patient presents with  . Abdominal Pain    (Consider location/radiation/quality/duration/timing/severity/associated sxs/prior treatment) Patient is a 36 y.o. male presenting with abdominal pain. The history is provided by the patient.  Abdominal Pain The primary symptoms of the illness include abdominal pain, nausea, vomiting, diarrhea and dysuria. The primary symptoms of the illness do not include fever or shortness of breath. The current episode started 2 days ago. The onset of the illness was gradual. The problem has been gradually worsening.  The abdominal pain began 2 days ago. The pain came on gradually. The abdominal pain has been gradually worsening since its onset. The abdominal pain is located in the epigastric region. Pain radiation: back. The severity of the abdominal pain is 8/10. The abdominal pain is relieved by nothing. Exacerbated by: nothing.  The dysuria is not associated with hematuria.  Associated with: unknown. The patient has had a change in bowel habit (diarrhea). Risk factors for an acute abdominal problem include a history of abdominal surgery. Symptoms associated with the illness do not include hematuria.    Past Medical History  Diagnosis Date  . Hypertension     Multiple Hospitalizations  . Kidney stones   . Myocardial infarction     In [place], [place]  . Crohn's colitis   . Cocaine abuse     Started using after divorce; no use around time of MI    Past Surgical History  Procedure Date  . Lithotripsy   . Cardiac catheterization 2011  . Coronary angioplasty with stent placement 2011  . Small intestine surgery   . Renal biopsy 2000  . Other surgical history 2011    Percutaneous biliary tube    Family History  Problem Relation Age of Onset  . Heart disease Father     early death at age 35  . Diabetes Maternal Aunt   . Cancer      History    Substance Use Topics  . Smoking status: Current Everyday Smoker -- 0.5 packs/day    Types: Cigarettes  . Smokeless tobacco: Not on file  . Alcohol Use: No      Review of Systems  Constitutional: Negative for fever.  HENT: Negative for rhinorrhea, drooling and neck pain.   Eyes: Negative for pain.  Respiratory: Negative for cough and shortness of breath.   Cardiovascular: Negative for chest pain and leg swelling.  Gastrointestinal: Positive for nausea, vomiting, abdominal pain and diarrhea.  Genitourinary: Positive for dysuria. Negative for hematuria.  Musculoskeletal: Negative for gait problem.  Skin: Negative for color change.  Neurological: Negative for numbness and headaches.  Hematological: Negative for adenopathy.  Psychiatric/Behavioral: Negative for behavioral problems.  All other systems reviewed and are negative.    Allergies  Review of patient's allergies indicates no known allergies.  Home Medications   Current Outpatient Rx  Name Route Sig Dispense Refill  . ASPIRIN 325 MG PO TABS Oral Take 325 mg by mouth daily.    Marland Kitchen CLONIDINE HCL 0.1 MG PO TABS Oral Take 0.1 mg by mouth 2 (two) times daily.    . FUROSEMIDE 20 MG PO TABS Oral Take 20 mg by mouth daily.    Marland Kitchen LISINOPRIL 20 MG PO TABS Oral Take 20 mg by mouth daily.      BP 174/127  Pulse 90  Temp(Src) 98.1 F (36.7 C) (Oral)  Resp 18  SpO2 100%  Physical Exam  Constitutional: He is oriented to person, place, and time. He appears well-developed and well-nourished.  HENT:  Head: Normocephalic and atraumatic.  Right Ear: External ear normal.  Left Ear: External ear normal.  Nose: Nose normal.  Mouth/Throat: Oropharynx is clear and moist. No oropharyngeal exudate.  Eyes: Conjunctivae and EOM are normal. Pupils are equal, round, and reactive to light.  Neck: Normal range of motion. Neck supple.  Cardiovascular: Normal rate, regular rhythm, normal heart sounds and intact distal pulses.  Exam reveals no  gallop and no friction rub.   No murmur heard. Pulmonary/Chest: Effort normal and breath sounds normal. No respiratory distress. He has no wheezes.  Abdominal: Soft. Bowel sounds are normal. He exhibits no distension. There is tenderness (moderate diffuse ttp, but worse in epigastric region).  Musculoskeletal: Normal range of motion. He exhibits no edema and no tenderness.  Neurological: He is alert and oriented to person, place, and time.  Skin: Skin is warm. He is diaphoretic (mild).  Psychiatric: He has a normal mood and affect. His behavior is normal.    ED Course  Procedures (including critical care time)   Labs Reviewed  CBC  DIFFERENTIAL  COMPREHENSIVE METABOLIC PANEL  LIPASE, BLOOD  URINALYSIS, DIPSTICK ONLY   No results found.   No diagnosis found.    MDM  3:53 PM 36 y.o. male pw abdominal pain, N/V/D, that began 2 days ago and has progressively gotten worse. Pt admitted here 1 week ago, had CT abd/pelvis then w/out obvious source of pt's pain. Pt did have ARF then. Pt does note remote hx of renal bx, he does not know why he had this. Pt AFVSS here. Will get IVF, pain/nausea control, labs. Will get RUQ [place] and abdominal series.  RUQ US shows: mild gallbladder wall thickening to 5 mm.Small amount pericholecystic fluid present the gallbladder is not distended. No gallstones are present. The negative sonographic Murphy's sign.  Pt has elev lipase and very mild elev of LFT's. Suspect pt may need HIDA scan/Surgical eval. Will admit to hospitalist.   Clinical Impression 1. Epigastric abdominal pain   2. Vomiting   3. Elevated serum creatinine         Purvis Sheffield, MD 01/25/12 [phone]

## 2012-01-24 NOTE — H&P (Signed)
DATE OF ADMISSION:  01/24/2012  PCP:    [name], MD, MD   Chief Complaint:  ABD Pain   HPI: Blake Gonzalez is an 36 y.o. male presenting with complaints of Epigastric and RUQ ABD Pain x 3 days.  He reports having nausea and vomiting and loose stools.  He denies having any hematemesis, or hematochezia, or melena passage.  He also denies having fevers or chills.   He was hospitalized on 01/17/2012 for similar complaints and left AMA at that time.  His labwork revealed elevated Liver transaminases and a Hepatitis panel was done at that time and reveal a reactive Hepatitis C antibody.    Past Medical History  Diagnosis Date  . Hypertension     Multiple Hospitalizations  . Kidney stones   . Myocardial infarction     In [place], [place]  . Crohn's colitis   . Cocaine abuse     Started using after divorce; no use around time of MI  . Hepatitis C     01/17/2012 labs    Past Surgical History  Procedure Date  . Lithotripsy   . Cardiac catheterization 2011  . Coronary angioplasty with stent placement 2011  . Small intestine surgery   . Renal biopsy 2000  . Other surgical history 2011    Percutaneous biliary tube    Medications:  HOME MEDS: Prior to Admission medications   Medication Sig Start Date End Date Taking? Authorizing Provider  aspirin 325 MG tablet Take 325 mg by mouth daily.   Yes Historical Provider, MD  cloNIDine (CATAPRES) 0.1 MG tablet Take 0.1 mg by mouth 2 (two) times daily.   Yes Historical Provider, MD  furosemide (LASIX) 20 MG tablet Take 20 mg by mouth daily.   Yes Historical Provider, MD  lisinopril (PRINIVIL,ZESTRIL) 20 MG tablet Take 20 mg by mouth daily.   Yes Historical Provider, MD    Allergies:  No Known Allergies  Social History:   reports that he has been smoking Cigarettes.  He has been smoking about .5 packs per day. He does not have any smokeless tobacco history on file. He reports that he does not drink alcohol or use illicit  drugs.  Family History: Family History  Problem Relation Age of Onset  . Heart disease Father     early death at age 58  . Diabetes Maternal Aunt   . Cancer      Review of Systems:  The patient denies anorexia, fever, weight loss, vision loss, decreased hearing, hoarseness, chest pain, syncope, dyspnea on exertion, peripheral edema, balance deficits, hemoptysis, abdominal pain, melena, hematochezia, severe indigestion/heartburn, hematuria, incontinence, genital sores, muscle weakness, suspicious skin lesions, transient blindness, difficulty walking, depression, unusual weight change, abnormal bleeding, enlarged lymph nodes, angioedema, and breast masses.   Physical Exam:  GEN:  Pleasant 36 year old well nourished and well developed thin Caucasian male examined  and in no acute distress; cooperative with exam Filed Vitals:   01/24/12 1900 01/24/12 2000 01/24/12 2105 01/24/12 2258  BP: 135/98 142/82 125/96 140/82  Pulse: 65 68 61 65  Temp:   98.4 F (36.9 C) 97.6 F (36.4 C)  TempSrc:   Oral Oral  Resp: 15 9 18 14   SpO2: 98% 96% 96% 97%   Blood pressure 140/82, pulse 65, temperature 97.6 F (36.4 C), temperature source Oral, resp. rate 14, SpO2 97.00%. PSYCH: He is alert and oriented x4; does not appear anxious does not appear depressed; affect is normal HEENT: Normocephalic and Atraumatic, Mucous membranes  pink; PERRLA; EOM intact; Fundi:  Benign;  No scleral icterus, Nares: Patent, Oropharynx: Clear, Fair Dentition, Neck:  FROM, no cervical lymphadenopathy nor thyromegaly or carotid bruit; no JVD; Breasts:: Not examined CHEST WALL: No tenderness CHEST: Normal respiration, clear to auscultation bilaterally HEART: Regular rate and rhythm; no murmurs rubs or gallops BACK: No kyphosis or scoliosis; no CVA tenderness ABDOMEN: Positive Bowel Sounds,  soft non-tender; no masses, no organomegaly.   Rectal Exam: Not done EXTREMITIES: No bone or joint deformity; age-appropriate  arthropathy of the hands and knees; no cyanosis, clubbing or edema; no ulcerations. Genitalia: not examined PULSES: 2+ and symmetric SKIN: Normal hydration no rash or ulceration CNS: Cranial nerves 2-12 grossly intact no focal neurologic deficit   Labs & Imaging Results for orders placed during the hospital encounter of 01/24/12 (from the past 48 hour(s))  URINALYSIS, DIPSTICK ONLY     Status: Abnormal   Collection Time   01/24/12  4:05 PM      Component Value Range Comment   Specific Gravity, Urine 1.015  1.005 - 1.030     pH 5.5  5.0 - 8.0     Glucose, UA NEGATIVE  NEGATIVE (mg/dL)    Hgb urine dipstick TRACE (*) NEGATIVE     Bilirubin Urine NEGATIVE  NEGATIVE     Ketones, ur NEGATIVE  NEGATIVE (mg/dL)    Protein, ur 30 (*) NEGATIVE (mg/dL)    Urobilinogen, UA 0.2  0.0 - 1.0 (mg/dL)    Nitrite NEGATIVE  NEGATIVE     Leukocytes, UA NEGATIVE  NEGATIVE    COMPREHENSIVE METABOLIC PANEL     Status: Abnormal   Collection Time   01/24/12  8:50 PM      Component Value Range Comment   Sodium 138  135 - 145 (mEq/L)    Potassium 5.3 (*) 3.5 - 5.1 (mEq/L)    Chloride 113 (*) 96 - 112 (mEq/L)    CO2 16 (*) 19 - 32 (mEq/L)    Glucose, Bld 81  70 - 99 (mg/dL)    BUN 27 (*) 6 - 23 (mg/dL)    Creatinine, Ser 4.09 (*) 0.50 - 1.35 (mg/dL)    Calcium 8.5  8.4 - 10.5 (mg/dL)    Total Protein 6.0  6.0 - 8.3 (g/dL)    Albumin 3.1 (*) 3.5 - 5.2 (g/dL)    AST 55 (*) 0 - 37 (U/L)    ALT 55 (*) 0 - 53 (U/L)    Alkaline Phosphatase 96  39 - 117 (U/L)    Total Bilirubin 0.3  0.3 - 1.2 (mg/dL)    GFR calc non Af Amer 38 (*) >90 (mL/min)    GFR calc Af Amer 44 (*) >90 (mL/min)   LIPASE, BLOOD     Status: Abnormal   Collection Time   01/24/12  8:50 PM      Component Value Range Comment   Lipase 61 (*) 11 - 59 (U/L)   CBC     Status: Abnormal   Collection Time   01/24/12  9:15 PM      Component Value Range Comment   WBC 4.5  4.0 - 10.5 (K/uL)    RBC 4.73  4.22 - 5.81 (MIL/uL)    Hemoglobin 12.4 (*)  13.0 - 17.0 (g/dL)    HCT 81.1 (*) 91.4 - 52.0 (%)    MCV 81.2  78.0 - 100.0 (fL)    MCH 26.2  26.0 - 34.0 (pg)    MCHC 32.3  30.0 - 36.0 (  g/dL)    RDW 40.9 (*) 81.1 - 15.5 (%)    Platelets 106 (*) 150 - 400 (K/uL) PLATELET COUNT CONFIRMED BY SMEAR  DIFFERENTIAL     Status: Normal   Collection Time   01/24/12  9:15 PM      Component Value Range Comment   Neutrophils Relative 62  43 - 77 (%)    Lymphocytes Relative 26  12 - 46 (%)    Monocytes Relative 8  3 - 12 (%)    Eosinophils Relative 4  0 - 5 (%)    Basophils Relative 0  0 - 1 (%)    Neutro Abs 2.7  1.7 - 7.7 (K/uL)    Lymphs Abs 1.2  0.7 - 4.0 (K/uL)    Monocytes Absolute 0.4  0.1 - 1.0 (K/uL)    Eosinophils Absolute 0.2  0.0 - 0.7 (K/uL)    Basophils Absolute 0.0  0.0 - 0.1 (K/uL)    RBC Morphology POLYCHROMASIA PRESENT   ELLIPTOCYTES   US Abdomen Complete  01/24/2012  *RADIOLOGY REPORT*  Clinical Data:  Abdominal pain, nausea and vomiting  COMPLETE ABDOMINAL ULTRASOUND  Comparison:  CT 01/17/2012  Findings:  Gallbladder:  There is mild gallbladder wall thickening to 5 mm. Small amount pericholecystic fluid present the gallbladder is not distended.  No gallstones are present.  The negative sonographic Murphy's sign.  Common bile duct:  Upper limits of normal 5 mm.  Liver:  A benign-appearing calcification in the right hepatic lobe. No ductal dilatation.  IVC:  Appears normal.  Pancreas:  No focal abnormality seen.  Spleen:  Normal in size and echogenicity.  Right Kidney:  11.5cm in length.  No evidence of hydronephrosis or stones.  Left Kidney:  10.1cm in length.  No evidence of hydronephrosis or stones.  Abdominal aorta:  No aneurysm identified.  IMPRESSION:  Thickened gallbladder wall with mild pericholecystic fluid. Recommend correlation for acute cholecystitis.  There are no gallstones and negative sonographic Murphy's sign which mitigates against acute cholecystitis.  Original Report Authenticated By: Genevive Bi, M.D.   Dg  Abd Acute W/chest  01/24/2012  *RADIOLOGY REPORT*  Clinical Data: Abdominal pain.  Nausea, vomiting, diarrhea. History of small bowel resection.  ACUTE ABDOMEN SERIES (ABDOMEN 2 VIEW & CHEST 1 VIEW)  Comparison: Acute abdominal series 03/16/2006.  Findings: A focal density in the right upper lobe projects over the anterior second rib.  The heart size is normal.  The lungs are clear.  Supine and upright views of the abdomen demonstrate a nonspecific bowel gas pattern.  There is no evidence for obstruction or free air.  An additional focal rounded density projects over the dome of the liver.  This could be within the lungs.  The axial skeleton is unremarkable.  IMPRESSION:  1.  No acute abnormality of the abdomen. 2.  A focal density is projected over the right upper lobe may be within bone.  A pulmonary nodule is not excluded.  Recommend CT chest with contrast for further evaluation. 2.  An additional focal density is projected over the dome of the liver.  This could be in the liver or lungs.  Original Report Authenticated By: Jamesetta Orleans. MATTERN, M.D.      Assessment:  Present on Admission:  .Nausea and vomiting .Elevated transaminase level .Acute cholecystitis .Thrombocytopenia .Hypertension .Myocardial infarction   Hepatitis C  Plan:    Acute Cholecystitis- Pain control and antiemetics ordered, Clear liquid Diet and IVFs also ordered.  Consider GI evaluation and possible HIDA scan Refer for evaluation and treatment of his Hep C.   Reconcile Regular Meds PRN IV Hydralazine for HTN DVT Prophylaxis Other plans as per orders.    CODE STATUS:      FULL CODE       Blake Gonzalez C 01/24/2012, 11:15 PM

## 2012-01-24 NOTE — ED Notes (Signed)
Labs redrawn

## 2012-01-24 NOTE — ED Notes (Signed)
Patient transported to Ultrasound 

## 2012-01-24 NOTE — ED Notes (Signed)
Pt presents to department via [facility] for evaluation of epigastric pain, N/V and dysuria. Ongoing x2 days. History of small bowel obstruction. 8/10 pain upon arrival. Pt is conscious alert and oriented x4.

## 2012-01-24 NOTE — ED Notes (Signed)
Remains in US.

## 2012-01-24 NOTE — ED Notes (Signed)
Spoke with lab about bloodwork.  States they have never received any blood for the pt, just urine.  Spoke with phlebotomy and charge nurse.

## 2012-01-24 NOTE — ED Notes (Signed)
Gave pt. cup of sprite. RN aware of same.

## 2012-01-25 DIAGNOSIS — N179 Acute kidney failure, unspecified: Secondary | ICD-10-CM

## 2012-01-25 DIAGNOSIS — R1013 Epigastric pain: Secondary | ICD-10-CM

## 2012-01-25 DIAGNOSIS — I10 Essential (primary) hypertension: Secondary | ICD-10-CM

## 2012-01-25 DIAGNOSIS — I2510 Atherosclerotic heart disease of native coronary artery without angina pectoris: Secondary | ICD-10-CM

## 2012-01-25 LAB — CBC
HCT: 39.9 % (ref 39.0–52.0)
MCH: 26.6 pg (ref 26.0–34.0)
MCV: 83.6 fL (ref 78.0–100.0)
RBC: 4.77 MIL/uL (ref 4.22–5.81)
RDW: 20.7 % — ABNORMAL HIGH (ref 11.5–15.5)
WBC: 3.5 10*3/uL — ABNORMAL LOW (ref 4.0–10.5)

## 2012-01-25 LAB — BASIC METABOLIC PANEL
CO2: 19 mEq/L (ref 19–32)
Chloride: 112 mEq/L (ref 96–112)
Creatinine, Ser: 2.04 mg/dL — ABNORMAL HIGH (ref 0.50–1.35)
Glucose, Bld: 98 mg/dL (ref 70–99)

## 2012-01-25 MED ORDER — METRONIDAZOLE IN NACL 5-0.79 MG/ML-% IV SOLN
500.0000 mg | Freq: Three times a day (TID) | INTRAVENOUS | Status: DC
  Administered 2012-01-25 – 2012-01-26 (×5): 500 mg via INTRAVENOUS
  Filled 2012-01-25 (×6): qty 100

## 2012-01-25 MED ORDER — HYDRALAZINE HCL 20 MG/ML IJ SOLN
10.0000 mg | Freq: Four times a day (QID) | INTRAMUSCULAR | Status: DC | PRN
  Filled 2012-01-25 (×2): qty 0.5

## 2012-01-25 MED ORDER — CIPROFLOXACIN IN D5W 400 MG/200ML IV SOLN
400.0000 mg | Freq: Two times a day (BID) | INTRAVENOUS | Status: DC
  Administered 2012-01-25 – 2012-01-26 (×3): 400 mg via INTRAVENOUS
  Filled 2012-01-25 (×4): qty 200

## 2012-01-25 MED ORDER — HYDROMORPHONE HCL PF 1 MG/ML IJ SOLN: 1.0000 mg | INTRAMUSCULAR | Status: DC | PRN

## 2012-01-25 MED ORDER — CLONIDINE HCL 0.1 MG PO TABS
0.1000 mg | ORAL_TABLET | Freq: Two times a day (BID) | ORAL | Status: DC
  Administered 2012-01-25 – 2012-01-26 (×3): 0.1 mg via ORAL
  Filled 2012-01-25 (×4): qty 1

## 2012-01-25 MED ORDER — ASPIRIN 325 MG PO TABS
325.0000 mg | ORAL_TABLET | Freq: Every day | ORAL | Status: DC
  Administered 2012-01-25 – 2012-01-27 (×3): 325 mg via ORAL
  Filled 2012-01-25 (×3): qty 1

## 2012-01-25 NOTE — ED Provider Notes (Signed)
I saw and evaluated the patient, reviewed the resident's note and I agree with the findings and plan.   Dione Booze, MD 01/25/12 1459

## 2012-01-25 NOTE — Progress Notes (Signed)
Subjective: Still having epigastric/RUQ pain this AM. No vomiting.  Objective: Vital signs in last 24 hours: Temp:  [97.5 F (36.4 C)-98.4 F (36.9 C)] 98.1 F (36.7 C) (05/12 0607) Pulse Rate:  [61-90] 76  (05/12 0607) Resp:  [9-23] 18  (05/12 0607) BP: (125-190)/(78-127) 156/99 mmHg (05/12 0607) SpO2:  [96 %-100 %] 98 % (05/12 0607) Weight:  [70.308 kg (155 lb)] 70.308 kg (155 lb) (05/11 2357) Weight change:     Intake/Output from previous day: 05/11 0701 - 05/12 0700 In: 700 [I.V.:700] Out: 800 [Urine:800]     Physical Exam: General: Comfortable, alert, communicative, fully oriented, not short of breath at rest.  HEENT:  No clinical pallor, no jaundice, no conjunctival injection or discharge. Hydration status is fair. NECK:  Supple, JVP not seen, no carotid bruits, no palpable lymphadenopathy, no palpable goiter. CHEST:  Clinically clear to auscultation, no wheezes, no crackles. HEART:  Sounds 1 and 2 heard, normal, regular, no murmurs. ABDOMEN:  Flat, soft, moderately tender, in epigastrium and RUQ. Murphy's sign is equivocal. No palpable organomegaly, no palpable masses, normal bowel sounds. GENITALIA:  Not examined. LOWER EXTREMITIES:  No pitting edema, palpable peripheral pulses. MUSCULOSKELETAL SYSTEM:  unremarkable. CENTRAL NERVOUS SYSTEM:  No focal neurologic deficit on gross examination.  Lab Results:  [facility] 01/25/12 0655 01/24/12 2115  WBC 3.5* 4.5  HGB 12.7* 12.4*  HCT 39.9 38.4*  PLT 96* 106*    Basename 01/25/12 0655 01/24/12 2050  NA 141 138  K 4.3 5.3*  CL 112 113*  CO2 19 16*  GLUCOSE 98 81  BUN 26* 27*  CREATININE 2.04* 2.12*  CALCIUM 8.5 8.5   No results found for this or any previous visit (from the past 240 hour(s)).   Studies/Results: US Abdomen Complete  01/24/2012  *RADIOLOGY REPORT*  Clinical Data:  Abdominal pain, nausea and vomiting  COMPLETE ABDOMINAL ULTRASOUND  Comparison:  CT 01/17/2012  Findings:  Gallbladder:  There is  mild gallbladder wall thickening to 5 mm. Small amount pericholecystic fluid present the gallbladder is not distended.  No gallstones are present.  The negative sonographic Murphy's sign.  Common bile duct:  Upper limits of normal 5 mm.  Liver:  A benign-appearing calcification in the right hepatic lobe. No ductal dilatation.  IVC:  Appears normal.  Pancreas:  No focal abnormality seen.  Spleen:  Normal in size and echogenicity.  Right Kidney:  11.5cm in length.  No evidence of hydronephrosis or stones.  Left Kidney:  10.1cm in length.  No evidence of hydronephrosis or stones.  Abdominal aorta:  No aneurysm identified.  IMPRESSION:  Thickened gallbladder wall with mild pericholecystic fluid. Recommend correlation for acute cholecystitis.  There are no gallstones and negative sonographic Murphy's sign which mitigates against acute cholecystitis.  Original Report Authenticated By: Genevive Bi, M.D.   Dg Abd Acute W/chest  01/24/2012  *RADIOLOGY REPORT*  Clinical Data: Abdominal pain.  Nausea, vomiting, diarrhea. History of small bowel resection.  ACUTE ABDOMEN SERIES (ABDOMEN 2 VIEW & CHEST 1 VIEW)  Comparison: Acute abdominal series 03/16/2006.  Findings: A focal density in the right upper lobe projects over the anterior second rib.  The heart size is normal.  The lungs are clear.  Supine and upright views of the abdomen demonstrate a nonspecific bowel gas pattern.  There is no evidence for obstruction or free air.  An additional focal rounded density projects over the dome of the liver.  This could be within the lungs.  The axial skeleton is unremarkable.  IMPRESSION:  1.  No acute abnormality of the abdomen. 2.  A focal density is projected over the right upper lobe may be within bone.  A pulmonary nodule is not excluded.  Recommend CT chest with contrast for further evaluation. 2.  An additional focal density is projected over the dome of the liver.  This could be in the liver or lungs.  Original Report  Authenticated By: Jamesetta Orleans. MATTERN, M.D.    Medications: Scheduled Meds:   . aspirin  325 mg Oral Daily  . cloNIDine  0.1 mg Oral BID  . enoxaparin  40 mg Subcutaneous Q24H  .  HYDROmorphone (DILAUDID) injection  1 mg Intravenous STAT  .  HYDROmorphone (DILAUDID) injection  1 mg Intravenous STAT  .  HYDROmorphone (DILAUDID) injection  1 mg Intravenous STAT  .  morphine injection  4 mg Intravenous STAT  . ondansetron (ZOFRAN) IV  4 mg Intravenous Once  . pantoprazole (PROTONIX) IV  40 mg Intravenous Q24H  . sodium chloride  1,000 mL Intravenous STAT  . DISCONTD: enoxaparin  40 mg Subcutaneous Q24H   Continuous Infusions:   . sodium chloride 100 mL/hr at 01/25/12 0936   PRN Meds:.acetaminophen, acetaminophen, HYDROmorphone (DILAUDID) injection, ondansetron (ZOFRAN) IV, ondansetron, oxyCODONE, zolpidem  Assessment/Plan:  Principal Problem:  *Acute cholecystitis: Patient presented with 2 days of vomiting/upper abdominal pain. Lipase is normal, abdominal ultrasound of 01/24/12, showed thickened gallbladder wall with mild pericholecystic fluid, no  gallstones and negative sonographic Murphy's sign. Clinical picture is suspicious for acalculous acute cholecystitis, although acute gastritis is in the differential. Will keep NPO, continue iv fluids, antiemetics and PPI, start iv Ciprofloxacin/Flagyl, arrange HIDA scan, and if positive, will consult surgical team.  Active Problems:  1. Myocardial infarction: Patient has known history of myocardial infarction, and is s/p stent in the past. He has no chest pain or SOB at this time, and no clinical evidence of heart failure.  2. Hypertension: Contrlolled at present. We will adjust antihypertensive medications as indicated.  3. Query ARF (acute renal failure)/CKD: patient has a creatinine of 2.01. Creatinine was 2.0-2.10 during las hospitalization on 01/17/12-01/18/12. Baseline is unknown at this time, but suspect he has CKD. Will follow renal  indices on iv fluids, but avoid nephrotoxins.  4. Hepatitis C: Patient has known hepatitis C. His transaminasis are mildly elevated, and he has a thrombocytopenia., but appears otherwise, stable.  5. Tobacco abuse: Patient admits to smoking 4 cigarettes per day. He has been counseled appropriately.    LOS: 1 day   Hiliana Eilts,CHRISTOPHER 01/25/2012, 9:54 AM

## 2012-01-26 ENCOUNTER — Inpatient Hospital Stay (HOSPITAL_COMMUNITY): Payer: Self-pay

## 2012-01-26 ENCOUNTER — Encounter (HOSPITAL_COMMUNITY): Payer: Self-pay | Admitting: General Surgery

## 2012-01-26 DIAGNOSIS — R109 Unspecified abdominal pain: Secondary | ICD-10-CM

## 2012-01-26 DIAGNOSIS — I10 Essential (primary) hypertension: Secondary | ICD-10-CM

## 2012-01-26 DIAGNOSIS — I2510 Atherosclerotic heart disease of native coronary artery without angina pectoris: Secondary | ICD-10-CM

## 2012-01-26 DIAGNOSIS — N179 Acute kidney failure, unspecified: Secondary | ICD-10-CM

## 2012-01-26 DIAGNOSIS — K2900 Acute gastritis without bleeding: Secondary | ICD-10-CM | POA: Diagnosis present

## 2012-01-26 DIAGNOSIS — R1013 Epigastric pain: Secondary | ICD-10-CM

## 2012-01-26 LAB — COMPREHENSIVE METABOLIC PANEL
ALT: 67 U/L — ABNORMAL HIGH (ref 0–53)
AST: 61 U/L — ABNORMAL HIGH (ref 0–37)
Alkaline Phosphatase: 87 U/L (ref 39–117)
CO2: 18 mEq/L — ABNORMAL LOW (ref 19–32)
Calcium: 8.3 mg/dL — ABNORMAL LOW (ref 8.4–10.5)
GFR calc Af Amer: 51 mL/min — ABNORMAL LOW (ref >90)
GFR calc non Af Amer: 44 mL/min — ABNORMAL LOW (ref >90)
Glucose, Bld: 82 mg/dL (ref 70–99)
Potassium: 4.5 mEq/L (ref 3.5–5.1)
Sodium: 136 mEq/L (ref 135–145)
Total Protein: 5.4 g/dL — ABNORMAL LOW (ref 6.0–8.3)

## 2012-01-26 LAB — CBC
Hemoglobin: 12.3 g/dL — ABNORMAL LOW (ref 13.0–17.0)
MCH: 26.5 pg (ref 26.0–34.0)
Platelets: 104 10*3/uL — ABNORMAL LOW (ref 150–400)
RBC: 4.64 MIL/uL (ref 4.22–5.81)

## 2012-01-26 MED ORDER — CLONIDINE HCL 0.1 MG PO TABS
0.1000 mg | ORAL_TABLET | Freq: Three times a day (TID) | ORAL | Status: DC
  Filled 2012-01-26: qty 1

## 2012-01-26 MED ORDER — PANTOPRAZOLE SODIUM 40 MG PO TBEC
40.0000 mg | DELAYED_RELEASE_TABLET | Freq: Every day | ORAL | Status: DC
  Administered 2012-01-27: 40 mg via ORAL
  Filled 2012-01-26: qty 1

## 2012-01-26 MED ORDER — MORPHINE SULFATE 4 MG/ML IJ SOLN
2.8000 mg | INTRAMUSCULAR | Status: AC
  Administered 2012-01-26: 2.8 mg via INTRAVENOUS

## 2012-01-26 MED ORDER — CLONIDINE HCL 0.2 MG PO TABS
0.2000 mg | ORAL_TABLET | Freq: Three times a day (TID) | ORAL | Status: DC
  Administered 2012-01-26 – 2012-01-27 (×2): 0.2 mg via ORAL
  Filled 2012-01-26 (×4): qty 1

## 2012-01-26 MED ORDER — TECHNETIUM TC 99M MEBROFENIN IV KIT
5.0000 | PACK | Freq: Once | INTRAVENOUS | Status: AC | PRN
  Administered 2012-01-26: 5 via INTRAVENOUS

## 2012-01-26 NOTE — Consult Note (Signed)
Surgery is not an option.  The findings are most consistent with liver disease.  Blake Gonzalez. Gae Bon, MD, FACS [phone] [facility] Surgery

## 2012-01-26 NOTE — Discharge Summary (Signed)
Physician Discharge Summary  Patient ID: Blake Gonzalez MRN: [id number] DOB/AGE: Nov 20, 1975 36 y.o.  Admit date: 01/24/2012 Discharge date: 01/27/2012  Primary Care Physician:  [name], MD, MD   Discharge Diagnoses:    Patient Active Problem List  Diagnoses  . Myocardial infarction  . Hypertension  . Crohn's colitis  . Cocaine abuse  . Epigastric abdominal pain  . ARF (acute renal failure)  . Nausea and vomiting  . Elevated transaminase level  . Thrombocytopenia  . Gastritis, acute    Medication List  As of 01/27/2012 12:38 PM   STOP taking these medications         furosemide 20 MG tablet      lisinopril 20 MG tablet         TAKE these medications         aspirin 325 MG tablet   Take 325 mg by mouth daily.      cloNIDine 0.2 MG tablet   Commonly known as: CATAPRES   Take 1 tablet (0.2 mg total) by mouth 3 (three) times daily.      oxyCODONE 5 MG immediate release tablet   Commonly known as: Oxy IR/ROXICODONE   Take 1 tablet (5 mg total) by mouth every 6 (six) hours as needed.      pantoprazole 40 MG tablet   Commonly known as: PROTONIX   Take 1 tablet (40 mg total) by mouth daily at 6 (six) AM.             Disposition and Follow-up:  Follow up with primary MD.  Consults:  general surgery  Dr [name], Surgeon.  Significant Diagnostic Studies:  US Abdomen Complete  01/24/2012  *RADIOLOGY REPORT*  Clinical Data:  Abdominal pain, nausea and vomiting  COMPLETE ABDOMINAL ULTRASOUND  Comparison:  CT 01/17/2012  Findings:  Gallbladder:  There is mild gallbladder wall thickening to 5 mm. Small amount pericholecystic fluid present the gallbladder is not distended.  No gallstones are present.  The negative sonographic Murphy's sign.  Common bile duct:  Upper limits of normal 5 mm.  Liver:  A benign-appearing calcification in the right hepatic lobe. No ductal dilatation.  IVC:  Appears normal.  Pancreas:  No focal abnormality seen.  Spleen:  Normal in size  and echogenicity.  Right Kidney:  11.5cm in length.  No evidence of hydronephrosis or stones.  Left Kidney:  10.1cm in length.  No evidence of hydronephrosis or stones.  Abdominal aorta:  No aneurysm identified.  IMPRESSION:  Thickened gallbladder wall with mild pericholecystic fluid. Recommend correlation for acute cholecystitis.  There are no gallstones and negative sonographic Murphy's sign which mitigates against acute cholecystitis.  Original Report Authenticated By: Genevive Bi, M.D.   Dg Abd Acute W/chest  01/24/2012  *RADIOLOGY REPORT*  Clinical Data: Abdominal pain.  Nausea, vomiting, diarrhea. History of small bowel resection.  ACUTE ABDOMEN SERIES (ABDOMEN 2 VIEW & CHEST 1 VIEW)  Comparison: Acute abdominal series 03/16/2006.  Findings: A focal density in the right upper lobe projects over the anterior second rib.  The heart size is normal.  The lungs are clear.  Supine and upright views of the abdomen demonstrate a nonspecific bowel gas pattern.  There is no evidence for obstruction or free air.  An additional focal rounded density projects over the dome of the liver.  This could be within the lungs.  The axial skeleton is unremarkable.  IMPRESSION:  1.  No acute abnormality of the abdomen. 2.  A focal density is projected  over the right upper lobe may be within bone.  A pulmonary nodule is not excluded.  Recommend CT chest with contrast for further evaluation. 2.  An additional focal density is projected over the dome of the liver.  This could be in the liver or lungs.  Original Report Authenticated By: Jamesetta Orleans. MATTERN, M.D.    Brief H and P: For complete details, refer to admission H and P. However, in brief, this is a 36 year old male, with known history of HTN, urolithiasis, s/p lithotripsy, CAD, previous MI, s/p stent, Crohn's colitis, cocaine use, hepatitis C, cholecystitis approximately one year ago, managed with biliary drain, presenting with three days of epigastric/RUQ pain,  associated with nausea, vomiting and loose stools. He denies having any hematemesis, or hematochezia, or melena passage. He also denies having fevers or chills. His initial labwork revealed elevated transaminases and he was admitted for further evaluation, investigation and management.   Physical Exam: On 01/27/12. General: Comfortable, alert, communicative, fully oriented, not short of breath at rest.  HEENT: No clinical pallor, no jaundice, no conjunctival injection or discharge. Hydration status is fair.  NECK: Supple, JVP not seen, no carotid bruits, no palpable lymphadenopathy, no palpable goiter.  CHEST: Clinically clear to auscultation, no wheezes, no crackles.  HEART: Sounds 1 and 2 heard, normal, regular, no murmurs.  ABDOMEN: Flat, soft, mild discomfort in epigastrium and RUQ. No palpable organomegaly, no palpable masses, normal bowel sounds.  GENITALIA: Not examined.  LOWER EXTREMITIES: No pitting edema, palpable peripheral pulses.  MUSCULOSKELETAL SYSTEM: unremarkable.  CENTRAL NERVOUS SYSTEM: No focal neurologic deficit on gross examination.   Hospital Course:  Principal Problem:  *Acute Gastritis: Patient presented with 2 days of vomiting/upper abdominal pain. Lipase was normal, abdominal ultrasound of 01/24/12, showed thickened gallbladder wall with mild pericholecystic fluid, no gallstones and negative sonographic Murphy's sign. Clinical picture was suspicious for acalculous acute cholecystitis, although acute gastritis was in the differential. Management was instituted with bowel rest, iv fluids, antiemetics and PPI. Patient was commenced on Ciprofloxacin/Flagyl. HIDA scan was done on 01/26/12, and showed delayed gallbladder activity, visible only after the study was augmented with morphine. This raised suspicion of chronic cholecystitis, particularly as a year ago, patient had an acute attack of cholecystitis, and had a biliary drain for 9 months afterwards, as surgery could not be  performed while patient was on Plavix for a coronary stent. Surgical consultation was provided by Dr [name], who has opined that there is no indication for cholecystectomy at this time. Antibiotics were discontinued and patient commenced on regular diet, which he a has tolerated. It appears that he did have an acute gastritis afterall, although there may be mild biliary dyskinesia. Active Problems:  1. Myocardial infarction: Patient has known history of myocardial infarction, and is s/p stent in the past. He has no chest pain or SOB at this time, and no clinical evidence of heart failure.  2. Hypertension: This was sub-optimally controlled, and required some adjustment of his antihypertensive medications.  3. ARF (acute renal failure)/CKD: patient has a creatinine of 2.01. Creatinine was 2.0-2.10 during last hospitalization on 01/17/12-01/18/12. Baseline is unknown at this time, but suspect he has baseline CKD. Creatinine has remained stable, even with iv fluids, was 1.88 on 01/26/12 and 2.02 on 01/27/12.  4. Hepatitis C: Patient has known hepatitis C. His transaminases are mildly elevated, and he has a thrombocytopenia, but appears otherwise, stable.  5. Tobacco abuse: Patient admits to smoking 4 cigarettes per day. He has  been counseled appropriately.   Comment: stable for discharge on 01/27/12.  Time spent on Discharge: 40 mins.  Signed: Keiandre Cygan,CHRISTOPHER 01/27/2012, 12:38 PM

## 2012-01-26 NOTE — Discharge Summary (Signed)
LATE ENTRY  Family Medicine Teaching Service DISCHARGE SUMMARY PT LEFT AMA   Patient name: Blake Gonzalez Medical record number: [id number] Date of birth: 12/06/1975 Age: 36 y.o. Gender: male  Attending Physician: No att. providers found Primary Care Provider: [name], MD, MD Consultants: None  Dates of Hospitalization:  01/17/2012 to 01/18/2012 Length of Stay: 1 days  Admission Diagnoses:  Abdominal Pain   HOSPITAL COURSE  Pt was admitted overnight on 01/17/2012 for concerns of new onset RLQ pain.  Of significance he has a history of Crohn's disease with history of small bowel resection as well as a biliary drain for cholecystitis, History of an MI at age 74.  Additionally he has a history of fragmentation of care while living in multiple states over the past 5 to 7 years.    Non-contrast CT scan was negative for intra-abdominal process and the pt was afebrile without a leukocytosis.  CT Scan also revealed multiple pulmonary nodules with concern for pulmonary metastasis.  All of the clinical findings were discussed with the pt at the time of admission and he voiced understanding of the potential severity of his medical issues.  Early morning on 5/5 nursing went to check on the pt and he had removed his IV  (while still running) and left a note stating he had to leave for a job that was starting. Risk management was notified.  Andrena Mews, DO [facility] Resident - PGY-1 01/26/2012 6:13 AM

## 2012-01-26 NOTE — Consult Note (Signed)
Blake Gonzalez 08-31-1976  [id number].   Primary Care MD: none in [place] Requesting MD: Dr. Isidor Holts Chief Complaint/Reason for Consult: possible cholecystitis HPI: This is a 36 yo male who about 1.5-2 years ago had an MI and developed cholecystitis.  He had a percutaneous cholecystostomy drain placed at [facility] while he had cardiac stents placed and was on Plavix.  He has since stopped taking plavix.  On Saturday, the patient developed some epigastric and RUQ abdominal pain.  He had one episode of emesis on Saturday, but then ate cereal and tolerated it well.  He's had no fevers or further nausea.  He states he still have some abdominal pain which is why he came to the [facility].  Upon arrival he was found to have slightly elevated AST and ALT in mid 50s.  His lipase was 61.  He was Hep C +, but upon further questioning he has been Hep C + for over 10 years and at one time was treated with interferon for this.  He has not had his numbers checked in over 5 years.  He also had an abd u/s that revealed some mild gb wall thickening with mild pericholecystic fluid.  He has no gallstones.  He then had a HIDA scan that reveals visualization of the gallbladder after morphine injection.  This could represent chronic cholecystitis.  Hence we have been consulted.  Review of Systems: Please see HPI, otherwise all other systems are currently negative except for HTN.  Family History  Problem Relation Age of Onset  . Heart disease Father     early death at age 29  . Diabetes Maternal Aunt   . Cancer      Past Medical History  Diagnosis Date  . Hypertension     Multiple Hospitalizations  . Kidney stones   . Myocardial infarction     In [place], [place]  . Crohn's colitis   . Cocaine abuse     Started using after divorce; no use around time of MI  . Hepatitis C     01/17/2012 labs; he has had Hep C for over 10 yrs.  Was treated with interferon for a while.    Past Surgical History  Procedure Date  .  Lithotripsy   . Cardiac catheterization 2011  . Coronary angioplasty with stent placement 2011  . Small intestine surgery   . Renal biopsy 2000  . Other surgical history 2011    Percutaneous biliary tube    Social History:  reports that he has been smoking Cigarettes.  He has been smoking about .5 packs per day. He does not have any smokeless tobacco history on file. He reports that he does not drink alcohol or use illicit drugs.  Allergies: No Known Allergies  Medications Prior to Admission  Medication Sig Dispense Refill  . aspirin 325 MG tablet Take 325 mg by mouth daily.      . cloNIDine (CATAPRES) 0.1 MG tablet Take 0.1 mg by mouth 2 (two) times daily.      . furosemide (LASIX) 20 MG tablet Take 20 mg by mouth daily.      Marland Kitchen lisinopril (PRINIVIL,ZESTRIL) 20 MG tablet Take 20 mg by mouth daily.        Blood pressure 155/94, pulse 81, temperature 98.4 F (36.9 C), temperature source Oral, resp. rate 18, height 6' (1.829 m), weight 155 lb (70.308 kg), SpO2 100.00%. Physical Exam: General: pleasant, WD, WN white male who is laying in bed in NAD HEENT: head is  normocephalic, atraumatic.  Sclera are noninjected.  PERRL.  Ears and nose without any masses or lesions.  Mouth is pink and moist Heart: regular, rate, and rhythm.  Normal s1,s2. No obvious murmurs, gallops, or rubs noted.  Palpable radial and pedal pulses bilaterally Lungs: CTAB, no wheezes, rhonchi, or rales noted.  Respiratory effort nonlabored Abd: soft, mildly tender in LUQ, RLQ, RUQ, and epigastrium, ND, +BS, no masses, hernias, or organomegaly.  No Murphy's sign is identified.  He does have a midline scar from prior SBR.  Scar in RUQ from prior perc chole drain. MS: all 4 extremities are symmetrical with no cyanosis or edema.  He does have clubbing of his fingernails. Skin: warm and dry with no masses, lesions, or rashes.  Multiple LE tattoos Psych: A&Ox3 with an appropriate affect.    Results for orders placed during  the hospital encounter of 01/24/12 (from the past 48 hour(s))  COMPREHENSIVE METABOLIC PANEL     Status: Abnormal   Collection Time   01/24/12  8:50 PM      Component Value Range Comment   Sodium 138  135 - 145 (mEq/L)    Potassium 5.3 (*) 3.5 - 5.1 (mEq/L)    Chloride 113 (*) 96 - 112 (mEq/L)    CO2 16 (*) 19 - 32 (mEq/L)    Glucose, Bld 81  70 - 99 (mg/dL)    BUN 27 (*) 6 - 23 (mg/dL)    Creatinine, Ser 1.61 (*) 0.50 - 1.35 (mg/dL)    Calcium 8.5  8.4 - 10.5 (mg/dL)    Total Protein 6.0  6.0 - 8.3 (g/dL)    Albumin 3.1 (*) 3.5 - 5.2 (g/dL)    AST 55 (*) 0 - 37 (U/L)    ALT 55 (*) 0 - 53 (U/L)    Alkaline Phosphatase 96  39 - 117 (U/L)    Total Bilirubin 0.3  0.3 - 1.2 (mg/dL)    GFR calc non Af Amer 38 (*) >90 (mL/min)    GFR calc Af Amer 44 (*) >90 (mL/min)   LIPASE, BLOOD     Status: Abnormal   Collection Time   01/24/12  8:50 PM      Component Value Range Comment   Lipase 61 (*) 11 - 59 (U/L)   CBC     Status: Abnormal   Collection Time   01/24/12  9:15 PM      Component Value Range Comment   WBC 4.5  4.0 - 10.5 (K/uL)    RBC 4.73  4.22 - 5.81 (MIL/uL)    Hemoglobin 12.4 (*) 13.0 - 17.0 (g/dL)    HCT 09.6 (*) 04.5 - 52.0 (%)    MCV 81.2  78.0 - 100.0 (fL)    MCH 26.2  26.0 - 34.0 (pg)    MCHC 32.3  30.0 - 36.0 (g/dL)    RDW 40.9 (*) 81.1 - 15.5 (%)    Platelets 106 (*) 150 - 400 (K/uL) PLATELET COUNT CONFIRMED BY SMEAR  DIFFERENTIAL     Status: Normal   Collection Time   01/24/12  9:15 PM      Component Value Range Comment   Neutrophils Relative 62  43 - 77 (%)    Lymphocytes Relative 26  12 - 46 (%)    Monocytes Relative 8  3 - 12 (%)    Eosinophils Relative 4  0 - 5 (%)    Basophils Relative 0  0 - 1 (%)    Neutro Abs 2.7  1.7 - 7.7 (K/uL)    Lymphs Abs 1.2  0.7 - 4.0 (K/uL)    Monocytes Absolute 0.4  0.1 - 1.0 (K/uL)    Eosinophils Absolute 0.2  0.0 - 0.7 (K/uL)    Basophils Absolute 0.0  0.0 - 0.1 (K/uL)    RBC Morphology POLYCHROMASIA PRESENT   ELLIPTOCYTES    BASIC METABOLIC PANEL     Status: Abnormal   Collection Time   01/25/12  6:55 AM      Component Value Range Comment   Sodium 141  135 - 145 (mEq/L)    Potassium 4.3  3.5 - 5.1 (mEq/L)    Chloride 112  96 - 112 (mEq/L)    CO2 19  19 - 32 (mEq/L)    Glucose, Bld 98  70 - 99 (mg/dL)    BUN 26 (*) 6 - 23 (mg/dL)    Creatinine, Ser 0.10 (*) 0.50 - 1.35 (mg/dL)    Calcium 8.5  8.4 - 10.5 (mg/dL)    GFR calc non Af Amer 40 (*) >90 (mL/min)    GFR calc Af Amer 46 (*) >90 (mL/min)   CBC     Status: Abnormal   Collection Time   01/25/12  6:55 AM      Component Value Range Comment   WBC 3.5 (*) 4.0 - 10.5 (K/uL)    RBC 4.77  4.22 - 5.81 (MIL/uL)    Hemoglobin 12.7 (*) 13.0 - 17.0 (g/dL)    HCT 27.2  53.6 - 64.4 (%)    MCV 83.6  78.0 - 100.0 (fL)    MCH 26.6  26.0 - 34.0 (pg)    MCHC 31.8  30.0 - 36.0 (g/dL)    RDW 03.4 (*) 74.2 - 15.5 (%)    Platelets 96 (*) 150 - 400 (K/uL) CONSISTENT WITH PREVIOUS RESULT  CBC     Status: Abnormal   Collection Time   01/26/12  7:05 AM      Component Value Range Comment   WBC 3.8 (*) 4.0 - 10.5 (K/uL)    RBC 4.64  4.22 - 5.81 (MIL/uL)    Hemoglobin 12.3 (*) 13.0 - 17.0 (g/dL)    HCT 59.5 (*) 63.8 - 52.0 (%)    MCV 83.4  78.0 - 100.0 (fL)    MCH 26.5  26.0 - 34.0 (pg)    MCHC 31.8  30.0 - 36.0 (g/dL)    RDW 75.6 (*) 43.3 - 15.5 (%)    Platelets 104 (*) 150 - 400 (K/uL) CONSISTENT WITH PREVIOUS RESULT  COMPREHENSIVE METABOLIC PANEL     Status: Abnormal   Collection Time   01/26/12  7:05 AM      Component Value Range Comment   Sodium 136  135 - 145 (mEq/L)    Potassium 4.5  3.5 - 5.1 (mEq/L)    Chloride 110  96 - 112 (mEq/L)    CO2 18 (*) 19 - 32 (mEq/L)    Glucose, Bld 82  70 - 99 (mg/dL)    BUN 21  6 - 23 (mg/dL)    Creatinine, Ser 2.95 (*) 0.50 - 1.35 (mg/dL)    Calcium 8.3 (*) 8.4 - 10.5 (mg/dL)    Total Protein 5.4 (*) 6.0 - 8.3 (g/dL)    Albumin 2.9 (*) 3.5 - 5.2 (g/dL)    AST 61 (*) 0 - 37 (U/L)    ALT 67 (*) 0 - 53 (U/L)    Alkaline  Phosphatase 87  39 - 117 (U/L)  Total Bilirubin 0.4  0.3 - 1.2 (mg/dL)    GFR calc non Af Amer 44 (*) >90 (mL/min)    GFR calc Af Amer 51 (*) >90 (mL/min)    Nm Hepatobiliary  01/26/2012  *RADIOLOGY REPORT*  Clinical Data:  36 year old male with right-side abdominal pain. History of small bowel resection.  Nausea and vomiting. Gallbladder wall thickening without cholelithiasis on ultrasound.  NUCLEAR MEDICINE HEPATOBILIARY IMAGING  Technique:  Sequential images of the abdomen were obtained out to 60 minutes following intravenous administration of radiopharmaceutical.  Radiopharmaceutical:  6.5 mCi Tc-56m Choletec (5 mCi initially and 1.5 mCi booster dose following morphine).  Comparison:  Abdominal ultrasound 01/24/2012, CT abdomen 01/17/2012.  Findings: Prompt radiotracer uptake by the liver and clearance of the blood pool.  CBD and small bowel activity is present by 20 minutes, but no gallbladder activity occurs during the first hour of the exam.  Small bowel activity progresses and radiotracer activity in the liver washes out.  Decision was made to augment the study with IV morphine (2.8 mg). 10 minutes post morphine gallbladder activity is present, and this increases over the remaining 30 of the study.   IMPRESSION: Delayed gallbladder activity, visible only after the study was augmented with morphine.  Constellation of findings compatible with chronic cholecystitis.  Original Report Authenticated By: Harley Hallmark, M.D.   US Abdomen Complete  01/24/2012  *RADIOLOGY REPORT*  Clinical Data:  Abdominal pain, nausea and vomiting  COMPLETE ABDOMINAL ULTRASOUND  Comparison:  CT 01/17/2012  Findings:  Gallbladder:  There is mild gallbladder wall thickening to 5 mm. Small amount pericholecystic fluid present the gallbladder is not distended.  No gallstones are present.  The negative sonographic Murphy's sign.  Common bile duct:  Upper limits of normal 5 mm.  Liver:  A benign-appearing calcification in the  right hepatic lobe. No ductal dilatation.  IVC:  Appears normal.  Pancreas:  No focal abnormality seen.  Spleen:  Normal in size and echogenicity.  Right Kidney:  11.5cm in length.  No evidence of hydronephrosis or stones.  Left Kidney:  10.1cm in length.  No evidence of hydronephrosis or stones.  Abdominal aorta:  No aneurysm identified.  IMPRESSION:  Thickened gallbladder wall with mild pericholecystic fluid. Recommend correlation for acute cholecystitis.  There are no gallstones and negative sonographic Murphy's sign which mitigates against acute cholecystitis.  Original Report Authenticated By: Genevive Bi, M.D.   Dg Abd Acute W/chest  01/24/2012  *RADIOLOGY REPORT*  Clinical Data: Abdominal pain.  Nausea, vomiting, diarrhea. History of small bowel resection.  ACUTE ABDOMEN SERIES (ABDOMEN 2 VIEW & CHEST 1 VIEW)  Comparison: Acute abdominal series 03/16/2006.  Findings: A focal density in the right upper lobe projects over the anterior second rib.  The heart size is normal.  The lungs are clear.  Supine and upright views of the abdomen demonstrate a nonspecific bowel gas pattern.  There is no evidence for obstruction or free air.  An additional focal rounded density projects over the dome of the liver.  This could be within the lungs.  The axial skeleton is unremarkable.  IMPRESSION:  1.  No acute abnormality of the abdomen. 2.  A focal density is projected over the right upper lobe may be within bone.  A pulmonary nodule is not excluded.  Recommend CT chest with contrast for further evaluation. 2.  An additional focal density is projected over the dome of the liver.  This could be in the liver or lungs.  Original Report  Authenticated By: Jamesetta Orleans. MATTERN, M.D.       Assessment/Plan 1. Abdominal pain, ? Secondary to hepatitis vs chronic cholecystitis 2. HTN 3. ARF 4. Thrombocytopenia 5. H/o MI 6. H/o crohn's (possibly) 7. Hepatitis C (was treated in the past)  Plan: 1. The patient has  abdominal pain that is relatively diffuse but worse in the RUQ and epigastrium.  He has a h/o Hep C.  He did have a HIDA scan that showed filling of the gallbladder, although delayed.  This could be seen as chronic cholecystitis or have this appearance due to his hepatitis.  I will discuss case with Dr. Lindie Spruce and await his recommendations for the patient and the current situation.  Reyden Smith E 01/26/2012, 4:32 PM

## 2012-01-26 NOTE — Progress Notes (Addendum)
Subjective: No new issues.   Objective: Vital signs in last 24 hours: Temp:  [97.4 F (36.3 C)-98.4 F (36.9 C)] 98.4 F (36.9 C) (05/13 1420) Pulse Rate:  [64-81] 81  (05/13 1420) Resp:  [18] 18  (05/13 1420) BP: (137-191)/(88-120) 191/120 mmHg (05/13 1420) SpO2:  [100 %] 100 % (05/13 1420) Weight change:  Last BM Date: 01/18/12  Intake/Output from previous day: 05/12 0701 - 05/13 0700 In: 2080 [P.O.:780; I.V.:1300] Out: 3300 [Urine:3300] Total I/O In: -  Out: 600 [Urine:600]   Physical Exam: General: Comfortable, alert, communicative, fully oriented, not short of breath at rest.  HEENT:  No clinical pallor, no jaundice, no conjunctival injection or discharge. Hydration status is fair. NECK:  Supple, JVP not seen, no carotid bruits, no palpable lymphadenopathy, no palpable goiter. CHEST:  Clinically clear to auscultation, no wheezes, no crackles. HEART:  Sounds 1 and 2 heard, normal, regular, no murmurs. ABDOMEN:  Flat, soft, moderately tender, in epigastrium and RUQ. Murphy's sign is equivocal. No palpable organomegaly, no palpable masses, normal bowel sounds. GENITALIA:  Not examined. LOWER EXTREMITIES:  No pitting edema, palpable peripheral pulses. MUSCULOSKELETAL SYSTEM:  unremarkable. CENTRAL NERVOUS SYSTEM:  No focal neurologic deficit on gross examination.  Lab Results:  [facility] 01/26/12 0705 01/25/12 0655  WBC 3.8* 3.5*  HGB 12.3* 12.7*  HCT 38.7* 39.9  PLT 104* 96*    Basename 01/26/12 0705 01/25/12 0655  NA 136 141  K 4.5 4.3  CL 110 112  CO2 18* 19  GLUCOSE 82 98  BUN 21 26*  CREATININE 1.88* 2.04*  CALCIUM 8.3* 8.5   No results found for this or any previous visit (from the past 240 hour(s)).   Studies/Results: US Abdomen Complete  01/24/2012  *RADIOLOGY REPORT*  Clinical Data:  Abdominal pain, nausea and vomiting  COMPLETE ABDOMINAL ULTRASOUND  Comparison:  CT 01/17/2012  Findings:  Gallbladder:  There is mild gallbladder wall thickening to 5  mm. Small amount pericholecystic fluid present the gallbladder is not distended.  No gallstones are present.  The negative sonographic Murphy's sign.  Common bile duct:  Upper limits of normal 5 mm.  Liver:  A benign-appearing calcification in the right hepatic lobe. No ductal dilatation.  IVC:  Appears normal.  Pancreas:  No focal abnormality seen.  Spleen:  Normal in size and echogenicity.  Right Kidney:  11.5cm in length.  No evidence of hydronephrosis or stones.  Left Kidney:  10.1cm in length.  No evidence of hydronephrosis or stones.  Abdominal aorta:  No aneurysm identified.  IMPRESSION:  Thickened gallbladder wall with mild pericholecystic fluid. Recommend correlation for acute cholecystitis.  There are no gallstones and negative sonographic Murphy's sign which mitigates against acute cholecystitis.  Original Report Authenticated By: Genevive Bi, M.D.   Dg Abd Acute W/chest  01/24/2012  *RADIOLOGY REPORT*  Clinical Data: Abdominal pain.  Nausea, vomiting, diarrhea. History of small bowel resection.  ACUTE ABDOMEN SERIES (ABDOMEN 2 VIEW & CHEST 1 VIEW)  Comparison: Acute abdominal series 03/16/2006.  Findings: A focal density in the right upper lobe projects over the anterior second rib.  The heart size is normal.  The lungs are clear.  Supine and upright views of the abdomen demonstrate a nonspecific bowel gas pattern.  There is no evidence for obstruction or free air.  An additional focal rounded density projects over the dome of the liver.  This could be within the lungs.  The axial skeleton is unremarkable.  IMPRESSION:  1.  No acute abnormality  of the abdomen. 2.  A focal density is projected over the right upper lobe may be within bone.  A pulmonary nodule is not excluded.  Recommend CT chest with contrast for further evaluation. 2.  An additional focal density is projected over the dome of the liver.  This could be in the liver or lungs.  Original Report Authenticated By: Jamesetta Orleans. MATTERN,  M.D.    Medications: Scheduled Meds:    . aspirin  325 mg Oral Daily  . ciprofloxacin  400 mg Intravenous Q12H  . cloNIDine  0.1 mg Oral BID  . enoxaparin  40 mg Subcutaneous Q24H  . metronidazole  500 mg Intravenous Q8H  .  morphine injection  2.8 mg Intravenous NOW  . pantoprazole (PROTONIX) IV  40 mg Intravenous Q24H   Continuous Infusions:    . sodium chloride 100 mL/hr at 01/26/12 1427   PRN Meds:.acetaminophen, acetaminophen, hydrALAZINE, HYDROmorphone (DILAUDID) injection, ondansetron (ZOFRAN) IV, ondansetron, oxyCODONE, technetium TC 38M mebrofenin, zolpidem  Assessment/Plan:  Principal Problem:  *Acute cholecystitis: Patient presented with 2 days of vomiting/upper abdominal pain. Lipase was normal, abdominal ultrasound of 01/24/12, showed thickened gallbladder wall with mild pericholecystic fluid, no gallstones and negative sonographic Murphy's sign. Clinical picture is suspicious for acalculous acute cholecystitis, although acute gastritis is in the differential. Management was instituted with bowel rest, iv fluids, antiemetics and PPI. Patient is now on Ciprofloxacin/Flagyl, day# 2. HIDA scan was done on 01/26/12, and report is pending. If positive, we will consult surgical team, for definitive management. It appears that a year ago, patient had an acute attack of cholecystitis, and had a biliary drain for 9 months afterwards, as surgery could not be performed while patient was on Plavix for a coronary stent. Active Problems:  1. Myocardial infarction: Patient has known history of myocardial infarction, and is s/p stent in the past. He has no chest pain or SOB at this time, and no clinical evidence of heart failure.  2. Hypertension: Contrlolled at present. We will adjust antihypertensive medications as indicated. Patient had a bump in BP this AM, suspect as he did not have antihypertensives, prior to HIDA. We shall observe for now. 3. ARF (acute renal failure)/CKD: patient has  a creatinine of 2.01. Creatinine was 2.0-2.10 during last hospitalization on 01/17/12-01/18/12. Baseline is unknown at this time, but suspect he has baseline CKD. Creatinine has improved with iv fluids, and is 1.88 today. Will follow renal indices on iv fluids.  4. Hepatitis C: Patient has known hepatitis C. His transaminases are mildly elevated, and he has a thrombocytopenia., but appears otherwise, stable.  5. Tobacco abuse: Patient admits to smoking 4 cigarettes per day. He has been counseled appropriately.    LOS: 2 days   Takeshi Teasdale,CHRISTOPHER 01/26/2012, 2:30 PM

## 2012-01-26 NOTE — Progress Notes (Signed)
This is not acute cholecystitis with delayed filling of the GB on HIDA.  Pain is equivocal.  No reason to perform cholecystectomy.  Blake Gonzalez. Blake Bon, MD, FACS [phone] [facility] Surgery

## 2012-01-26 NOTE — Discharge Summary (Signed)
FMTS Attending Daily Note: Denny Levy MD [phone] pager office [phone] I have discussed this patient with the resident and reviewed the assessment and plan as documented above.Patient left AMA without letting us know he was leaving.

## 2012-01-27 DIAGNOSIS — N179 Acute kidney failure, unspecified: Secondary | ICD-10-CM

## 2012-01-27 DIAGNOSIS — R1013 Epigastric pain: Secondary | ICD-10-CM

## 2012-01-27 DIAGNOSIS — I10 Essential (primary) hypertension: Secondary | ICD-10-CM

## 2012-01-27 DIAGNOSIS — I2510 Atherosclerotic heart disease of native coronary artery without angina pectoris: Secondary | ICD-10-CM

## 2012-01-27 LAB — CBC
HCT: 36.8 % — ABNORMAL LOW (ref 39.0–52.0)
Hemoglobin: 12 g/dL — ABNORMAL LOW (ref 13.0–17.0)
MCH: 26.9 pg (ref 26.0–34.0)
MCHC: 32.6 g/dL (ref 30.0–36.0)
RDW: 20.7 % — ABNORMAL HIGH (ref 11.5–15.5)

## 2012-01-27 LAB — COMPREHENSIVE METABOLIC PANEL
Albumin: 3.1 g/dL — ABNORMAL LOW (ref 3.5–5.2)
BUN: 21 mg/dL (ref 6–23)
Calcium: 8.5 mg/dL (ref 8.4–10.5)
GFR calc Af Amer: 47 mL/min — ABNORMAL LOW (ref >90)
Glucose, Bld: 112 mg/dL — ABNORMAL HIGH (ref 70–99)
Total Protein: 5.6 g/dL — ABNORMAL LOW (ref 6.0–8.3)

## 2012-01-27 MED ORDER — OXYCODONE HCL 5 MG PO TABS: 5.0000 mg | ORAL_TABLET | Freq: Four times a day (QID) | ORAL | Status: DC | PRN

## 2012-01-27 MED ORDER — PANTOPRAZOLE SODIUM 40 MG PO TBEC: 40.0000 mg | DELAYED_RELEASE_TABLET | Freq: Every day | ORAL | Status: DC

## 2012-01-27 MED ORDER — CLONIDINE HCL 0.2 MG PO TABS: 0.2000 mg | ORAL_TABLET | Freq: Three times a day (TID) | ORAL | Status: DC

## 2012-01-27 NOTE — Care Management Note (Signed)
    Page 1 of 1   01/27/2012     4:21:40 PM   CARE MANAGEMENT NOTE 01/27/2012  Patient:  Blake Gonzalez, Blake Gonzalez   Account Number:  [id number]  Date Initiated:  01/27/2012  Documentation initiated by:  Jim Like  Subjective/Objective Assessment:   Pt is 36 yr old admitted with RLQ abdominal pain     Action/Plan:   No CM/discharge planning needs identified   Anticipated DC Date:  01/18/2012   Anticipated DC Plan:  AGAINST MEDICAL ADVICE         Choice offered to / List presented to:             Status of service:  Completed, signed off Medicare Important Message given?   (If response is "NO", the following Medicare IM given date fields will be blank) Date Medicare IM given:   Date Additional Medicare IM given:    Discharge Disposition:  AGAINST MEDICAL ADVICE  Per UR Regulation:  Reviewed for med. necessity/level of care/duration of stay  If discussed at Long Length of Stay Meetings, dates discussed:    Comments:

## 2012-01-30 ENCOUNTER — Emergency Department (HOSPITAL_COMMUNITY): Payer: Self-pay

## 2012-01-30 ENCOUNTER — Encounter (HOSPITAL_COMMUNITY): Payer: Self-pay | Admitting: *Deleted

## 2012-01-30 ENCOUNTER — Emergency Department (HOSPITAL_COMMUNITY)
Admission: EM | Admit: 2012-01-30 | Discharge: 2012-01-30 | Disposition: A | Discharge status: Home / Self Care | Payer: Self-pay | Attending: Emergency Medicine | Admitting: Emergency Medicine

## 2012-01-30 DIAGNOSIS — K5090 Crohn's disease, unspecified, without complications: Secondary | ICD-10-CM | POA: Insufficient documentation

## 2012-01-30 DIAGNOSIS — I252 Old myocardial infarction: Secondary | ICD-10-CM | POA: Insufficient documentation

## 2012-01-30 DIAGNOSIS — Z8619 Personal history of other infectious and parasitic diseases: Secondary | ICD-10-CM | POA: Insufficient documentation

## 2012-01-30 DIAGNOSIS — I10 Essential (primary) hypertension: Secondary | ICD-10-CM | POA: Insufficient documentation

## 2012-01-30 DIAGNOSIS — Z79899 Other long term (current) drug therapy: Secondary | ICD-10-CM | POA: Insufficient documentation

## 2012-01-30 DIAGNOSIS — R1013 Epigastric pain: Secondary | ICD-10-CM | POA: Insufficient documentation

## 2012-01-30 DIAGNOSIS — Z7982 Long term (current) use of aspirin: Secondary | ICD-10-CM | POA: Insufficient documentation

## 2012-01-30 DIAGNOSIS — R112 Nausea with vomiting, unspecified: Secondary | ICD-10-CM | POA: Insufficient documentation

## 2012-01-30 LAB — COMPREHENSIVE METABOLIC PANEL
Albumin: 3.6 g/dL (ref 3.5–5.2)
Alkaline Phosphatase: 116 U/L (ref 39–117)
BUN: 20 mg/dL (ref 6–23)
CO2: 18 mEq/L — ABNORMAL LOW (ref 19–32)
Chloride: 111 mEq/L (ref 96–112)
GFR calc non Af Amer: 35 mL/min — ABNORMAL LOW (ref >90)
Glucose, Bld: 91 mg/dL (ref 70–99)
Potassium: 4 mEq/L (ref 3.5–5.1)
Total Bilirubin: 0.3 mg/dL (ref 0.3–1.2)

## 2012-01-30 LAB — URINALYSIS, ROUTINE W REFLEX MICROSCOPIC
Leukocytes, UA: NEGATIVE
Protein, ur: 100 mg/dL — AB
Specific Gravity, Urine: 1.019 (ref 1.005–1.030)
Urobilinogen, UA: 0.2 mg/dL (ref 0.0–1.0)

## 2012-01-30 LAB — DIFFERENTIAL
Basophils Absolute: 0 10*3/uL (ref 0.0–0.1)
Basophils Relative: 0 % (ref 0–1)
Eosinophils Relative: 4 % (ref 0–5)
Lymphocytes Relative: 25 % (ref 12–46)
Monocytes Relative: 10 % (ref 3–12)
Neutro Abs: 3.4 10*3/uL (ref 1.7–7.7)

## 2012-01-30 LAB — LIPASE, BLOOD: Lipase: 83 U/L — ABNORMAL HIGH (ref 11–59)

## 2012-01-30 LAB — CBC
HCT: 41 % (ref 39.0–52.0)
Hemoglobin: 14 g/dL (ref 13.0–17.0)
MCV: 80.7 fL (ref 78.0–100.0)
RBC: 5.08 MIL/uL (ref 4.22–5.81)
WBC: 5.6 10*3/uL (ref 4.0–10.5)

## 2012-01-30 LAB — URINE MICROSCOPIC-ADD ON

## 2012-01-30 MED ORDER — HYDROMORPHONE HCL PF 1 MG/ML IJ SOLN
1.0000 mg | Freq: Once | INTRAMUSCULAR | Status: AC
  Administered 2012-01-30: 1 mg via INTRAVENOUS
  Filled 2012-01-30: qty 1

## 2012-01-30 MED ORDER — DICYCLOMINE HCL 10 MG/ML IM SOLN
20.0000 mg | Freq: Once | INTRAMUSCULAR | Status: AC
  Administered 2012-01-30: 20 mg via INTRAMUSCULAR
  Filled 2012-01-30: qty 2

## 2012-01-30 MED ORDER — ONDANSETRON HCL 4 MG/2ML IJ SOLN
4.0000 mg | Freq: Once | INTRAMUSCULAR | Status: AC
  Administered 2012-01-30: 4 mg via INTRAVENOUS
  Filled 2012-01-30: qty 2

## 2012-01-30 MED ORDER — PROMETHAZINE HCL 25 MG PO TABS: 25.0000 mg | ORAL_TABLET | Freq: Four times a day (QID) | ORAL | Status: DC | PRN

## 2012-01-30 MED ORDER — DICYCLOMINE HCL 20 MG PO TABS: 20.0000 mg | ORAL_TABLET | Freq: Two times a day (BID) | ORAL | Status: DC

## 2012-01-30 NOTE — ED Provider Notes (Signed)
Medical screening examination/treatment/procedure(s) were performed by non-physician practitioner and as supervising physician I was immediately available for consultation/collaboration.  Olivia Mackie, MD 01/30/12 [phone]

## 2012-01-30 NOTE — ED Notes (Signed)
Per EMS - pt d/c from hospital on Tuesday for gallbladder issues - pt had return of symptoms on Thursday, ride-side abd pain, n/v.

## 2012-01-30 NOTE — ED Notes (Signed)
Patient transported to Ultrasound 

## 2012-01-30 NOTE — ED Notes (Signed)
Rx given x2 D/c instructions reviewed w/ pt - pt denies any further questions or concerns at present.   

## 2012-01-30 NOTE — ED Notes (Signed)
Pt reports mid-abd and rt sided abd pain that began Wednesday evening, progressively worse- admits to x1 episode of vomiting tonight and x1 episode of diarrhea this evening. Pt w/ grimacing and guarding on assessment, pleasant and cooperative - A&Ox4. Pt states that he was recently hospitalized for same x4 days for "gallbladder problems."

## 2012-01-30 NOTE — Discharge Instructions (Signed)
Abdominal Pain Abdominal pain can be caused by many things. Your caregiver decides the seriousness of your pain by an examination and possibly blood tests and X-rays. Many cases can be observed and treated at home. Most abdominal pain is not caused by a disease and will probably improve without treatment. However, in many cases, more time must pass before a clear cause of the pain can be found. Before that point, it may not be known if you need more testing, or if hospitalization or surgery is needed. HOME CARE INSTRUCTIONS   Do not take laxatives unless directed by your caregiver.   Take pain medicine only as directed by your caregiver.   Only take over-the-counter or prescription medicines for pain, discomfort, or fever as directed by your caregiver.   Try a clear liquid diet (broth, tea, or water) for as long as directed by your caregiver. Slowly move to a bland diet as tolerated.  SEEK IMMEDIATE MEDICAL CARE IF:   The pain does not go away.   You have a fever.   You keep throwing up (vomiting).   The pain is felt only in portions of the abdomen. Pain in the right side could possibly be appendicitis. In an adult, pain in the left lower portion of the abdomen could be colitis or diverticulitis.   You pass bloody or black tarry stools.  MAKE SURE YOU:   Understand these instructions.   Will watch your condition.   Will get help right away if you are not doing well or get worse.  Document Released: 06/11/2005 Document Revised: 08/21/2011 Document Reviewed: 04/19/2008 [facility] Patient Information 2012 [place], [place].Amylase Amylase is an enzyme. It is measured in the blood. This test can provide information on the function of your pancreas. This test is used to diagnose swelling of the pancreas (pancreatitis) and other pancreatic diseases. The rise of amylase at the beginning of a pancreatitis attack, and its fall after about 2 days, helps to make a diagnosis. This test may be done if  you have problems that suggest a disorder of the pancreas. Such problems may include severe abdominal pain, fever, loss of appetite, or nausea. Long-standing pancreatitis may be associated with alcoholism. It can also be caused by injury or a blocked pancreatic duct. It may also be associated with genetic abnormalities such as cystic fibrosis. Amylase levels may be only moderately increased with chronic pancreatitis. Amylase levels may be only moderately decreased when the cells that produce amylase in the pancreas are injured or destroyed. Other causes of an increase in this enzyme include problems with the bowel, ovaries, skeletal muscle, or mumps. Amylase is also used in the diagnosis and follow-up of cancer of the pancreas, ovaries, or lungs, gallbladder attack, and mumps.  PREPARATION FOR TEST No preparation or fasting is necessary. A blood sample will be drawn from a vein in the arm.  NORMAL FINDINGS   Newborn: 6 to 65 units/L   Adults: 60 to 180 units/L (0.8 to 3.2 microkatal/L)  Amylase levels remain low for the first 2 months of life. They increase to adult values by 38 year of age. Values may be slightly increased during normal pregnancy and in older adults. Ranges for normal findings may vary among different laboratories and hospitals. You should always check with your caregiver after having lab work or other tests done to discuss the meaning of your test results and whether your values are considered within normal limits. MEANING OF TEST  Your caregiver will go over the test  results with you. Your caregiver will discuss the importance and meaning of your results. He or she will also discuss treatment options and additional tests, if needed. OBTAINING THE TEST RESULTS  It is your responsibility to obtain your test results. Ask the lab or department performing the test when and how you will get your results. Document Released: 09/23/2004 Document Revised: 08/21/2011 Document Reviewed:  08/07/2008 [facility] Patient Information 2012 [place], [place].Nausea and Vomiting Nausea is a sick feeling that often comes before throwing up (vomiting). Vomiting is a reflex where stomach contents come out of your mouth. Vomiting can cause severe loss of body fluids (dehydration). Children and elderly adults can become dehydrated quickly, especially if they also have diarrhea. Nausea and vomiting are symptoms of a condition or disease. It is important to find the cause of your symptoms. CAUSES   Direct irritation of the stomach lining. This irritation can result from increased acid production (gastroesophageal reflux disease), infection, food poisoning, taking certain medicines (such as nonsteroidal anti-inflammatory drugs), alcohol use, or tobacco use.   Signals from the brain.These signals could be caused by a headache, heat exposure, an inner ear disturbance, increased pressure in the brain from injury, infection, a tumor, or a concussion, pain, emotional stimulus, or metabolic problems.   An obstruction in the gastrointestinal tract (bowel obstruction).   Illnesses such as diabetes, hepatitis, gallbladder problems, appendicitis, kidney problems, cancer, sepsis, atypical symptoms of a heart attack, or eating disorders.   Medical treatments such as chemotherapy and radiation.   Receiving medicine that makes you sleep (general anesthetic) during surgery.  DIAGNOSIS Your caregiver may ask for tests to be done if the problems do not improve after a few days. Tests may also be done if symptoms are severe or if the reason for the nausea and vomiting is not clear. Tests may include:  Urine tests.   Blood tests.   Stool tests.   Cultures (to look for evidence of infection).   X-rays or other imaging studies.  Test results can help your caregiver make decisions about treatment or the need for additional tests. TREATMENT You need to stay well hydrated. Drink frequently but in small  amounts.You may wish to drink water, sports drinks, clear broth, or eat frozen ice pops or gelatin dessert to help stay hydrated.When you eat, eating slowly may help prevent nausea.There are also some antinausea medicines that may help prevent nausea. HOME CARE INSTRUCTIONS   Take all medicine as directed by your caregiver.   If you do not have an appetite, do not force yourself to eat. However, you must continue to drink fluids.   If you have an appetite, eat a normal diet unless your caregiver tells you differently.   Eat a variety of complex carbohydrates (rice, wheat, potatoes, bread), lean meats, yogurt, fruits, and vegetables.   Avoid high-fat foods because they are more difficult to digest.   Drink enough water and fluids to keep your urine clear or pale yellow.   If you are dehydrated, ask your caregiver for specific rehydration instructions. Signs of dehydration may include:   Severe thirst.   Dry lips and mouth.   Dizziness.   Dark urine.   Decreasing urine frequency and amount.   Confusion.   Rapid breathing or pulse.  SEEK IMMEDIATE MEDICAL CARE IF:   You have blood or brown flecks (like coffee grounds) in your vomit.   You have black or bloody stools.   You have a severe headache or stiff neck.  You are confused.   You have severe abdominal pain.   You have chest pain or trouble breathing.   You do not urinate at least once every 8 hours.   You develop cold or clammy skin.   You continue to vomit for longer than 24 to 48 hours.   You have a fever.  MAKE SURE YOU:   Understand these instructions.   Will watch your condition.   Will get help right away if you are not doing well or get worse.  Document Released: 09/01/2005 Document Revised: 08/21/2011 Document Reviewed: 01/29/2011 [facility] Patient Information 2012 [place], [place].

## 2012-01-30 NOTE — ED Provider Notes (Signed)
History     CSN: [id number]  Arrival date & time 01/30/12  0140   First MD Initiated Contact with Patient 01/30/12 [phone]      Chief Complaint  Patient presents with  . Abdominal Pain    (Consider location/radiation/quality/duration/timing/severity/associated sxs/prior treatment) HPI Comments: Patient here with RUQ abdominal pain - he states that he was just discharged on Tuesday for gallbladder issues - states that his pain and vomiting returned today after dinner - he reports that he has tried his pain medication without relief, denies fever, chills, reports 2 episodes of NBNB vomiting and worsening of abdominal pain.  Denies constipation or diarrhea.  States that he is supposed to follow up with GI next month but his insurance does not kick in until later in the month and so he cannot afford to see them now - he reports no change in appetite prior to this episode.  Patient is a 36 y.o. male presenting with abdominal pain. The history is provided by the patient. No language interpreter was used.  Abdominal Pain The primary symptoms of the illness include abdominal pain, nausea and vomiting. The primary symptoms of the illness do not include fever, fatigue, shortness of breath, diarrhea, hematemesis, hematochezia or dysuria. The current episode started 6 to 12 hours ago. The onset of the illness was gradual. The problem has been gradually worsening.  The illness is associated with eating. The patient has not had a change in bowel habit. Symptoms associated with the illness do not include chills, anorexia, diaphoresis, heartburn, constipation, urgency, hematuria, frequency or back pain. Significant associated medical issues include liver disease. Significant associated medical issues do not include gallstones.    Past Medical History  Diagnosis Date  . Hypertension     Multiple Hospitalizations  . Kidney stones   . Myocardial infarction     In [place], [place]  . Crohn's colitis   .  Cocaine abuse     Started using after divorce; no use around time of MI  . Hepatitis C     01/17/2012 labs    Past Surgical History  Procedure Date  . Lithotripsy   . Cardiac catheterization 2011  . Coronary angioplasty with stent placement 2011  . Small intestine surgery   . Renal biopsy 2000  . Other surgical history 2011    Percutaneous biliary tube    Family History  Problem Relation Age of Onset  . Heart disease Father     early death at age 38  . Diabetes Maternal Aunt   . Cancer      History  Substance Use Topics  . Smoking status: Current Everyday Smoker -- 0.5 packs/day    Types: Cigarettes  . Smokeless tobacco: Not on file  . Alcohol Use: No      Review of Systems  Constitutional: Negative for fever, chills, diaphoresis and fatigue.  Respiratory: Negative for shortness of breath.   Gastrointestinal: Positive for nausea, vomiting and abdominal pain. Negative for heartburn, diarrhea, constipation, hematochezia, anorexia and hematemesis.  Genitourinary: Negative for dysuria, urgency, frequency and hematuria.  Musculoskeletal: Negative for back pain.  All other systems reviewed and are negative.    Allergies  Review of patient's allergies indicates no known allergies.  Home Medications   Current Outpatient Rx  Name Route Sig Dispense Refill  . ASPIRIN 325 MG PO TABS Oral Take 325 mg by mouth daily.    Marland Kitchen CLONIDINE HCL 0.2 MG PO TABS Oral Take 1 tablet (0.2 mg total) by mouth 3 (  three) times daily. 90 tablet 0  . OXYCODONE HCL 5 MG PO TABS Oral Take 5 mg by mouth every 6 (six) hours as needed. For pain    . PANTOPRAZOLE SODIUM 40 MG PO TBEC Oral Take 1 tablet (40 mg total) by mouth daily at 6 (six) AM. 7 tablet 0  . NYQUIL PO Oral Take 30 mLs by mouth every 6 (six) hours as needed. For sleep      BP 166/113  Pulse 66  Temp(Src) 98.3 F (36.8 C) (Oral)  Resp 16  SpO2 98%  Physical Exam  Nursing note and vitals reviewed. Constitutional: He is  oriented to person, place, and time. He appears well-developed and well-nourished. He appears distressed.       Uncomfortable appearing  HENT:  Head: Normocephalic and atraumatic.  Right Ear: External ear normal.  Left Ear: External ear normal.  Nose: Nose normal.  Mouth/Throat: Oropharynx is clear and moist. No oropharyngeal exudate.  Eyes: Conjunctivae are normal. Pupils are equal, round, and reactive to light. No scleral icterus.  Neck: Normal range of motion. Neck supple.  Cardiovascular: Normal rate, regular rhythm and normal heart sounds.  Exam reveals no gallop and no friction rub.   No murmur heard. Pulmonary/Chest: Effort normal and breath sounds normal. No respiratory distress. He has no wheezes. He has no rales. He exhibits no tenderness.  Abdominal: Soft. Bowel sounds are normal. He exhibits no distension and no mass. There is tenderness in the right upper quadrant and epigastric area. There is guarding. There is no rebound.    Musculoskeletal: Normal range of motion. He exhibits no edema and no tenderness.  Lymphadenopathy:    He has no cervical adenopathy.  Neurological: He is alert and oriented to person, place, and time. No cranial nerve deficit. He exhibits normal muscle tone. Coordination normal.  Skin: Skin is warm and dry. No rash noted. No erythema. No pallor.  Psychiatric: He has a normal mood and affect. His behavior is normal. Judgment and thought content normal.    ED Course  Procedures (including critical care time)  Labs Reviewed  CBC - Abnormal; Notable for the following:    RDW 21.1 (*)    Platelets 126 (*)    All other components within normal limits  COMPREHENSIVE METABOLIC PANEL - Abnormal; Notable for the following:    CO2 18 (*)    Creatinine, Ser 2.26 (*)    AST 73 (*)    ALT 85 (*)    GFR calc non Af Amer 35 (*)    GFR calc Af Amer 41 (*)    All other components within normal limits  LIPASE, BLOOD - Abnormal; Notable for the following:     Lipase 83 (*)    All other components within normal limits  URINALYSIS, ROUTINE W REFLEX MICROSCOPIC - Abnormal; Notable for the following:    Hgb urine dipstick SMALL (*)    Bilirubin Urine SMALL (*)    Protein, ur 100 (*)    All other components within normal limits  URINE MICROSCOPIC-ADD ON - Abnormal; Notable for the following:    Casts HYALINE CASTS (*) GRANULAR CAST   All other components within normal limits  DIFFERENTIAL   US Abdomen Limited Ruq  01/30/2012  *RADIOLOGY REPORT*  Clinical Data:  Abdominal pain.  LIMITED ABDOMINAL ULTRASOUND - RIGHT UPPER QUADRANT  Comparison:  01/24/2012  Findings:  Gallbladder:  Diffuse gallbladder wall thickening, measuring up to about 5 mm.  No gallstones or sludge visualized.  Changes are similar to the previous study.  Common bile duct:  Normal caliber with measured diameter of about 5 mm.  Liver:  Normal homogeneous liver parenchymal echotexture except for calcification in the dome.  Visualized portions of the pancreas are unremarkable.  IMPRESSION: Nonspecific diffuse gallbladder wall thickening similar to previous study.  No stones or sludge.                   Original Report Authenticated By: Marlon Pel, M.D.   Results for orders placed during the hospital encounter of 01/30/12  CBC      Component Value Range   WBC 5.6  4.0 - 10.5 (K/uL)   RBC 5.08  4.22 - 5.81 (MIL/uL)   Hemoglobin 14.0  13.0 - 17.0 (g/dL)   HCT 96.0  45.4 - 09.8 (%)   MCV 80.7  78.0 - 100.0 (fL)   MCH 27.6  26.0 - 34.0 (pg)   MCHC 34.1  30.0 - 36.0 (g/dL)   RDW 11.9 (*) 14.7 - 15.5 (%)   Platelets 126 (*) 150 - 400 (K/uL)  DIFFERENTIAL      Component Value Range   Neutrophils Relative 61  43 - 77 (%)   Lymphocytes Relative 25  12 - 46 (%)   Monocytes Relative 10  3 - 12 (%)   Eosinophils Relative 4  0 - 5 (%)   Basophils Relative 0  0 - 1 (%)   Neutro Abs 3.4  1.7 - 7.7 (K/uL)   Lymphs Abs 1.4  0.7 - 4.0 (K/uL)   Monocytes Absolute 0.6  0.1 - 1.0 (K/uL)    Eosinophils Absolute 0.2  0.0 - 0.7 (K/uL)   Basophils Absolute 0.0  0.0 - 0.1 (K/uL)   RBC Morphology ELLIPTOCYTES    COMPREHENSIVE METABOLIC PANEL      Component Value Range   Sodium 141  135 - 145 (mEq/L)   Potassium 4.0  3.5 - 5.1 (mEq/L)   Chloride 111  96 - 112 (mEq/L)   CO2 18 (*) 19 - 32 (mEq/L)   Glucose, Bld 91  70 - 99 (mg/dL)   BUN 20  6 - 23 (mg/dL)   Creatinine, Ser 8.29 (*) 0.50 - 1.35 (mg/dL)   Calcium 9.2  8.4 - 56.2 (mg/dL)   Total Protein 6.5  6.0 - 8.3 (g/dL)   Albumin 3.6  3.5 - 5.2 (g/dL)   AST 73 (*) 0 - 37 (U/L)   ALT 85 (*) 0 - 53 (U/L)   Alkaline Phosphatase 116  39 - 117 (U/L)   Total Bilirubin 0.3  0.3 - 1.2 (mg/dL)   GFR calc non Af Amer 35 (*) >90 (mL/min)   GFR calc Af Amer 41 (*) >90 (mL/min)  LIPASE, BLOOD      Component Value Range   Lipase 83 (*) 11 - 59 (U/L)  URINALYSIS, ROUTINE W REFLEX MICROSCOPIC      Component Value Range   Color, Urine YELLOW  YELLOW    APPearance CLEAR  CLEAR    Specific Gravity, Urine 1.019  1.005 - 1.030    pH 5.5  5.0 - 8.0    Glucose, UA NEGATIVE  NEGATIVE (mg/dL)   Hgb urine dipstick SMALL (*) NEGATIVE    Bilirubin Urine SMALL (*) NEGATIVE    Ketones, ur NEGATIVE  NEGATIVE (mg/dL)   Protein, ur 130 (*) NEGATIVE (mg/dL)   Urobilinogen, UA 0.2  0.0 - 1.0 (mg/dL)   Nitrite NEGATIVE  NEGATIVE    Leukocytes,  UA NEGATIVE  NEGATIVE   URINE MICROSCOPIC-ADD ON      Component Value Range   RBC / HPF 0-2  <3 (RBC/hpf)   Casts HYALINE CASTS (*) NEGATIVE    Ct Abdomen Pelvis Wo Contrast  01/17/2012  *RADIOLOGY REPORT*  Clinical Data: 36 year old male with right-side abdominal pain. History of small bowel resection.  CT ABDOMEN AND PELVIS WITHOUT CONTRAST  Technique:  Multidetector CT imaging of the abdomen and pelvis was performed following the standard protocol without intravenous contrast.  Comparison: 03/14/2006.  Findings: Cardiomegaly.  No pleural effusion.  There are multiple lower lobe pulmonary nodules, more  numerous on the right.  These range in size from 4-9 mm.  None are clearly identified on the comparison.  Stable visualized osseous structures.  A small volume of pelvic free fluid.  No dilated bowel in the abdomen, but individual loops are not well delineated, this appears in part related to a degree of generalized mesenteric haziness. The bladder appears completely decompressed.  Postoperative changes at the cecum.  Probable neo-terminal ileum (series 2 image 59).  Stable chronic splenomegaly.  No abnormality of the pancreas or adrenal glands evident in the absence of contrast.  An exophytic left renal lesion described in 2007 with some atypical features now shows dystrophic type calcification (series 2 image 37.  The noncontrast left kidney otherwise appears stable within normal limits.  The right kidney appears stable within normal limits.  No retroperitoneal lymphadenopathy is evident.  Interestingly, small low density area in the posterior right hepatic lobe on the prior exam also now with calcified (series 2 image 17).  Elsewhere the noncontrast liver parenchyma is within normal limits.  Gallbladder decompressed.  Abdominal free fluid.  IMPRESSION: 1.  New suspicious lung nodules at both lung bases, more numerous on the right.  These are not typical of acute inflammation.  They might be post inflammatory, but none were present in 2007.  In conjunction with #2, early pulmonary metastatic disease cannot be excluded. 2.  Interval dystrophic calcification of the exophytic left renal lesion which demonstrated a typical features in 2007.  A small right hepatic lesion present at that time also has calcified.  Was the patient treated for a malignancy? 3.  No dilated bowel.  Postoperative changes in the right lower quadrant.  Small volume of nonspecific pelvic free fluid. 4. Cardiomegaly.  Original Report Authenticated By: Harley Hallmark, M.D.   Nm Hepatobiliary  01/26/2012  *RADIOLOGY REPORT*  Clinical Data:   36 year old male with right-side abdominal pain. History of small bowel resection.  Nausea and vomiting. Gallbladder wall thickening without cholelithiasis on ultrasound.  NUCLEAR MEDICINE HEPATOBILIARY IMAGING  Technique:  Sequential images of the abdomen were obtained out to 60 minutes following intravenous administration of radiopharmaceutical.  Radiopharmaceutical:  6.5 mCi Tc-36m Choletec (5 mCi initially and 1.5 mCi booster dose following morphine).  Comparison:  Abdominal ultrasound 01/24/2012, CT abdomen 01/17/2012.  Findings: Prompt radiotracer uptake by the liver and clearance of the blood pool.  CBD and small bowel activity is present by 20 minutes, but no gallbladder activity occurs during the first hour of the exam.  Small bowel activity progresses and radiotracer activity in the liver washes out.  Decision was made to augment the study with IV morphine (2.8 mg). 10 minutes post morphine gallbladder activity is present, and this increases over the remaining 30 of the study.   IMPRESSION: Delayed gallbladder activity, visible only after the study was augmented with morphine.  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  Original Report Authenticated By: Harley Hallmark, M.D.   US Abdomen Complete  01/24/2012  *RADIOLOGY REPORT*  Clinical Data:  Abdominal pain, nausea and vomiting  COMPLETE ABDOMINAL ULTRASOUND  Comparison:  CT 01/17/2012  Findings:  Gallbladder:  There is mild gallbladder wall thickening to 5 mm. Small amount pericholecystic fluid present the gallbladder is not distended.  No gallstones are present.  The negative sonographic Murphy's sign.  Common bile duct:  Upper limits of normal 5 mm.  Liver:  A benign-appearing calcification in the right hepatic lobe. No ductal dilatation.  IVC:  Appears normal.  Pancreas:  No focal abnormality seen.  Spleen:  Normal in size and echogenicity.  Right Kidney:  11.5cm in length.  No evidence of hydronephrosis or stones.  Left  Kidney:  10.1cm in length.  No evidence of hydronephrosis or stones.  Abdominal aorta:  No aneurysm identified.  IMPRESSION:  Thickened gallbladder wall with mild pericholecystic fluid. Recommend correlation for acute cholecystitis.  There are no gallstones and negative sonographic Murphy's sign which mitigates against acute cholecystitis.  Original Report Authenticated By: Genevive Bi, M.D.   Dg Abd Acute W/chest  01/24/2012  *RADIOLOGY REPORT*  Clinical Data: Abdominal pain.  Nausea, vomiting, diarrhea. History of small bowel resection.  ACUTE ABDOMEN SERIES (ABDOMEN 2 VIEW & CHEST 1 VIEW)  Comparison: Acute abdominal series 03/16/2006.  Findings: A focal density in the right upper lobe projects over the anterior second rib.  The heart size is normal.  The lungs are clear.  Supine and upright views of the abdomen demonstrate a nonspecific bowel gas pattern.  There is no evidence for obstruction or free air.  An additional focal rounded density projects over the dome of the liver.  This could be within the lungs.  The axial skeleton is unremarkable.  IMPRESSION:  1.  No acute abnormality of the abdomen. 2.  A focal density is projected over the right upper lobe may be within bone.  A pulmonary nodule is not excluded.  Recommend CT chest with contrast for further evaluation. 2.  An additional focal density is projected over the dome of the liver.  This could be in the liver or lungs.  Original Report Authenticated By: Jamesetta Orleans. MATTERN, M.D.   US Abdomen Limited Ruq  01/30/2012  *RADIOLOGY REPORT*  Clinical Data:  Abdominal pain.  LIMITED ABDOMINAL ULTRASOUND - RIGHT UPPER QUADRANT  Comparison:  01/24/2012  Findings:  Gallbladder:  Diffuse gallbladder wall thickening, measuring up to about 5 mm.  No gallstones or sludge visualized.  Changes are similar to the previous study.  Common bile duct:  Normal caliber with measured diameter of about 5 mm.  Liver:  Normal homogeneous liver parenchymal  echotexture except for calcification in the dome.  Visualized portions of the pancreas are unremarkable.  IMPRESSION: Nonspecific diffuse gallbladder wall thickening similar to previous study.  No stones or sludge.                   Original Report Authenticated By: Marlon Pel, M.D.      Abdominal pain   MDM  Patient with a history of likely chronic cholecystits and hep c presents with worsening pain s/p discharge from the hospital 2 days ago - there is not really any change in his condition, labs or ultrasound.  He reports pain under control after 2 mg of dilaudid and nausea medication - he has no anti-emetics at home so I will prescribe these and the  bentyl as well.  He reports he has some of his oxycodone at home which he will continue to take.  He is to follow up with GI next month.        Izola Price [place], [place] 01/30/12 [phone]

## 2015-12-15 HISTORY — PX: THORACIC AORTIC ANEURYSM REPAIR: SHX799

## 2016-09-05 ENCOUNTER — Other Ambulatory Visit: Payer: Self-pay

## 2016-09-05 ENCOUNTER — Emergency Department (HOSPITAL_COMMUNITY): Payer: Medicaid Other

## 2016-09-05 ENCOUNTER — Encounter (HOSPITAL_COMMUNITY): Payer: Self-pay | Admitting: *Deleted

## 2016-09-05 DIAGNOSIS — I712 Thoracic aortic aneurysm, without rupture: Secondary | ICD-10-CM | POA: Insufficient documentation

## 2016-09-05 DIAGNOSIS — Z955 Presence of coronary angioplasty implant and graft: Secondary | ICD-10-CM | POA: Diagnosis not present

## 2016-09-05 DIAGNOSIS — D696 Thrombocytopenia, unspecified: Secondary | ICD-10-CM | POA: Diagnosis not present

## 2016-09-05 DIAGNOSIS — K5010 Crohn's disease of large intestine without complications: Secondary | ICD-10-CM | POA: Diagnosis not present

## 2016-09-05 DIAGNOSIS — I252 Old myocardial infarction: Secondary | ICD-10-CM | POA: Diagnosis not present

## 2016-09-05 DIAGNOSIS — I129 Hypertensive chronic kidney disease with stage 1 through stage 4 chronic kidney disease, or unspecified chronic kidney disease: Secondary | ICD-10-CM | POA: Insufficient documentation

## 2016-09-05 DIAGNOSIS — R911 Solitary pulmonary nodule: Secondary | ICD-10-CM | POA: Insufficient documentation

## 2016-09-05 DIAGNOSIS — Z888 Allergy status to other drugs, medicaments and biological substances status: Secondary | ICD-10-CM | POA: Diagnosis not present

## 2016-09-05 DIAGNOSIS — Z8679 Personal history of other diseases of the circulatory system: Secondary | ICD-10-CM | POA: Insufficient documentation

## 2016-09-05 DIAGNOSIS — I2583 Coronary atherosclerosis due to lipid rich plaque: Secondary | ICD-10-CM | POA: Insufficient documentation

## 2016-09-05 DIAGNOSIS — Z7982 Long term (current) use of aspirin: Secondary | ICD-10-CM | POA: Insufficient documentation

## 2016-09-05 DIAGNOSIS — Z5321 Procedure and treatment not carried out due to patient leaving prior to being seen by health care provider: Secondary | ICD-10-CM | POA: Insufficient documentation

## 2016-09-05 DIAGNOSIS — I2510 Atherosclerotic heart disease of native coronary artery without angina pectoris: Secondary | ICD-10-CM | POA: Diagnosis not present

## 2016-09-05 DIAGNOSIS — Z8619 Personal history of other infectious and parasitic diseases: Secondary | ICD-10-CM | POA: Insufficient documentation

## 2016-09-05 DIAGNOSIS — I160 Hypertensive urgency: Principal | ICD-10-CM | POA: Insufficient documentation

## 2016-09-05 DIAGNOSIS — F1410 Cocaine abuse, uncomplicated: Secondary | ICD-10-CM | POA: Insufficient documentation

## 2016-09-05 DIAGNOSIS — Z87442 Personal history of urinary calculi: Secondary | ICD-10-CM | POA: Diagnosis not present

## 2016-09-05 DIAGNOSIS — I169 Hypertensive crisis, unspecified: Secondary | ICD-10-CM | POA: Diagnosis present

## 2016-09-05 DIAGNOSIS — N183 Chronic kidney disease, stage 3 (moderate): Secondary | ICD-10-CM | POA: Insufficient documentation

## 2016-09-05 DIAGNOSIS — I421 Obstructive hypertrophic cardiomyopathy: Secondary | ICD-10-CM | POA: Insufficient documentation

## 2016-09-05 DIAGNOSIS — Z9114 Patient's other noncompliance with medication regimen: Secondary | ICD-10-CM | POA: Diagnosis not present

## 2016-09-05 LAB — COMPREHENSIVE METABOLIC PANEL
ALT: 29 U/L (ref 17–63)
ANION GAP: 7 (ref 5–15)
AST: 45 U/L — ABNORMAL HIGH (ref 15–41)
Albumin: 3.6 g/dL (ref 3.5–5.0)
Alkaline Phosphatase: 206 U/L — ABNORMAL HIGH (ref 38–126)
BUN: 25 mg/dL — ABNORMAL HIGH (ref 6–20)
CHLORIDE: 112 mmol/L — AB (ref 101–111)
CO2: 21 mmol/L — AB (ref 22–32)
Calcium: 8.8 mg/dL — ABNORMAL LOW (ref 8.9–10.3)
Creatinine, Ser: 2.6 mg/dL — ABNORMAL HIGH (ref 0.61–1.24)
GFR calc non Af Amer: 29 mL/min — ABNORMAL LOW (ref >60)
GFR, EST AFRICAN AMERICAN: 33 mL/min — AB (ref >60)
Glucose, Bld: 105 mg/dL — ABNORMAL HIGH (ref 65–99)
Potassium: 4.5 mmol/L (ref 3.5–5.1)
SODIUM: 140 mmol/L (ref 135–145)
Total Bilirubin: 0.7 mg/dL (ref 0.3–1.2)
Total Protein: 7.2 g/dL (ref 6.5–8.1)

## 2016-09-05 LAB — CBC
HCT: 42.6 % (ref 39.0–52.0)
HEMOGLOBIN: 13.6 g/dL (ref 13.0–17.0)
MCH: 28.9 pg (ref 26.0–34.0)
MCHC: 31.9 g/dL (ref 30.0–36.0)
MCV: 90.6 fL (ref 78.0–100.0)
Platelets: 77 10*3/uL — ABNORMAL LOW (ref 150–400)
RBC: 4.7 MIL/uL (ref 4.22–5.81)
RDW: 15.9 % — ABNORMAL HIGH (ref 11.5–15.5)
WBC: 4.2 10*3/uL (ref 4.0–10.5)

## 2016-09-05 LAB — URINALYSIS, ROUTINE W REFLEX MICROSCOPIC
BILIRUBIN URINE: NEGATIVE
Bacteria, UA: NONE SEEN
Glucose, UA: NEGATIVE mg/dL
HGB URINE DIPSTICK: NEGATIVE
KETONES UR: NEGATIVE mg/dL
Leukocytes, UA: NEGATIVE
Nitrite: NEGATIVE
PH: 5 (ref 5.0–8.0)
Protein, ur: 100 mg/dL — AB
Specific Gravity, Urine: 1.016 (ref 1.005–1.030)

## 2016-09-05 NOTE — ED Triage Notes (Signed)
The pt is here because he needs a blood test for drugs which we do not do here.  He takes bp med but his bp is very high.  [facility] did [facility] urine drug test and it showed something but  He thinks it is due to his bp meds.  I have told him he can speak with the doctor when he is seen in the back

## 2016-09-06 ENCOUNTER — Observation Stay (HOSPITAL_COMMUNITY)
Admission: EM | Admit: 2016-09-06 | Discharge: 2016-09-06 | Discharge status: Against Medical Advice | Payer: Medicaid Other | Attending: Family Medicine | Admitting: Family Medicine

## 2016-09-06 ENCOUNTER — Encounter (HOSPITAL_COMMUNITY): Payer: Self-pay

## 2016-09-06 ENCOUNTER — Emergency Department (HOSPITAL_COMMUNITY): Payer: Medicaid Other

## 2016-09-06 DIAGNOSIS — I7101 Dissection of ascending aorta: Secondary | ICD-10-CM | POA: Diagnosis present

## 2016-09-06 DIAGNOSIS — D696 Thrombocytopenia, unspecified: Secondary | ICD-10-CM

## 2016-09-06 DIAGNOSIS — I422 Other hypertrophic cardiomyopathy: Secondary | ICD-10-CM

## 2016-09-06 DIAGNOSIS — I2510 Atherosclerotic heart disease of native coronary artery without angina pectoris: Secondary | ICD-10-CM

## 2016-09-06 DIAGNOSIS — I169 Hypertensive crisis, unspecified: Secondary | ICD-10-CM

## 2016-09-06 DIAGNOSIS — N184 Chronic kidney disease, stage 4 (severe): Secondary | ICD-10-CM

## 2016-09-06 DIAGNOSIS — I129 Hypertensive chronic kidney disease with stage 1 through stage 4 chronic kidney disease, or unspecified chronic kidney disease: Secondary | ICD-10-CM | POA: Diagnosis present

## 2016-09-06 DIAGNOSIS — R7989 Other specified abnormal findings of blood chemistry: Secondary | ICD-10-CM

## 2016-09-06 DIAGNOSIS — I2583 Coronary atherosclerosis due to lipid rich plaque: Secondary | ICD-10-CM

## 2016-09-06 DIAGNOSIS — B1920 Unspecified viral hepatitis C without hepatic coma: Secondary | ICD-10-CM | POA: Diagnosis present

## 2016-09-06 DIAGNOSIS — R778 Other specified abnormalities of plasma proteins: Secondary | ICD-10-CM | POA: Diagnosis present

## 2016-09-06 DIAGNOSIS — N183 Chronic kidney disease, stage 3 (moderate): Secondary | ICD-10-CM

## 2016-09-06 DIAGNOSIS — R748 Abnormal levels of other serum enzymes: Secondary | ICD-10-CM

## 2016-09-06 DIAGNOSIS — I160 Hypertensive urgency: Secondary | ICD-10-CM | POA: Diagnosis present

## 2016-09-06 HISTORY — DX: Dissection of ascending aorta: I71.010

## 2016-09-06 HISTORY — DX: Obstructive hypertrophic cardiomyopathy: I42.1

## 2016-09-06 HISTORY — DX: Chronic kidney disease, stage 3 (moderate): N18.3

## 2016-09-06 HISTORY — DX: Dissection of thoracic aorta: I71.01

## 2016-09-06 HISTORY — DX: Chronic kidney disease, stage 3 unspecified: N18.30

## 2016-09-06 LAB — TROPONIN I
TROPONIN I: 0.1 ng/mL — AB (ref <0.03)
Troponin I: 0.1 ng/mL (ref <0.03)
Troponin I: 0.1 ng/mL (ref <0.03)

## 2016-09-06 LAB — RAPID URINE DRUG SCREEN, HOSP PERFORMED
Amphetamines: NOT DETECTED
BARBITURATES: NOT DETECTED
BENZODIAZEPINES: NOT DETECTED
COCAINE: NOT DETECTED
Opiates: NOT DETECTED
TETRAHYDROCANNABINOL: NOT DETECTED

## 2016-09-06 LAB — MRSA PCR SCREENING: MRSA BY PCR: NEGATIVE

## 2016-09-06 MED ORDER — LABETALOL HCL 200 MG PO TABS
200.0000 mg | ORAL_TABLET | Freq: Three times a day (TID) | ORAL | Status: DC
  Administered 2016-09-06: 200 mg via ORAL
  Filled 2016-09-06: qty 1

## 2016-09-06 MED ORDER — METOCLOPRAMIDE HCL 5 MG/ML IJ SOLN
5.0000 mg | Freq: Once | INTRAMUSCULAR | Status: AC | PRN
  Administered 2016-09-06: 5 mg via INTRAVENOUS
  Filled 2016-09-06: qty 2

## 2016-09-06 MED ORDER — AMLODIPINE BESYLATE 10 MG PO TABS
10.0000 mg | ORAL_TABLET | Freq: Every day | ORAL | Status: DC
  Administered 2016-09-06: 10 mg via ORAL
  Filled 2016-09-06: qty 1

## 2016-09-06 MED ORDER — ACETAMINOPHEN 325 MG PO TABS
650.0000 mg | ORAL_TABLET | Freq: Four times a day (QID) | ORAL | Status: DC | PRN
  Administered 2016-09-06: 650 mg via ORAL
  Filled 2016-09-06: qty 2

## 2016-09-06 MED ORDER — CLONIDINE HCL 0.2 MG PO TABS
0.2000 mg | ORAL_TABLET | Freq: Three times a day (TID) | ORAL | Status: DC
  Administered 2016-09-06 (×2): 0.2 mg via ORAL
  Filled 2016-09-06 (×2): qty 1

## 2016-09-06 MED ORDER — ONDANSETRON HCL 4 MG PO TABS
4.0000 mg | ORAL_TABLET | Freq: Four times a day (QID) | ORAL | Status: DC | PRN
Start: 2016-09-06 — End: 2016-09-06

## 2016-09-06 MED ORDER — LABETALOL HCL 200 MG PO TABS
200.0000 mg | ORAL_TABLET | Freq: Two times a day (BID) | ORAL | Status: DC
Start: 2016-09-06 — End: 2016-09-06
  Administered 2016-09-06: 200 mg via ORAL
  Filled 2016-09-06: qty 1

## 2016-09-06 MED ORDER — NITROGLYCERIN IN D5W 200-5 MCG/ML-% IV SOLN
0.0000 ug/min | Freq: Once | INTRAVENOUS | Status: AC
Start: 2016-09-06 — End: 2016-09-06
  Administered 2016-09-06: 5 ug/min via INTRAVENOUS
  Filled 2016-09-06: qty 250

## 2016-09-06 MED ORDER — NICARDIPINE HCL IN NACL 20-0.86 MG/200ML-% IV SOLN
3.0000 mg/h | Freq: Once | INTRAVENOUS | Status: AC
Start: 2016-09-06 — End: 2016-09-06
  Administered 2016-09-06: 5 mg/h via INTRAVENOUS
  Filled 2016-09-06: qty 200

## 2016-09-06 MED ORDER — LABETALOL HCL 5 MG/ML IV SOLN
40.0000 mg | Freq: Once | INTRAVENOUS | Status: AC
  Administered 2016-09-06: 40 mg via INTRAVENOUS
  Filled 2016-09-06: qty 8

## 2016-09-06 MED ORDER — ONDANSETRON HCL 4 MG/2ML IJ SOLN: 4.0000 mg | Freq: Four times a day (QID) | INTRAMUSCULAR | Status: DC | PRN

## 2016-09-06 MED ORDER — CLONIDINE HCL 0.2 MG PO TABS
0.2000 mg | ORAL_TABLET | Freq: Once | ORAL | Status: AC
  Administered 2016-09-06: 0.2 mg via ORAL
  Filled 2016-09-06: qty 1

## 2016-09-06 MED ORDER — DIPHENHYDRAMINE HCL 50 MG/ML IJ SOLN
12.5000 mg | Freq: Once | INTRAMUSCULAR | Status: AC | PRN
  Administered 2016-09-06: 12.5 mg via INTRAVENOUS
  Filled 2016-09-06: qty 1

## 2016-09-06 MED ORDER — ASPIRIN 325 MG PO TABS
325.0000 mg | ORAL_TABLET | Freq: Every day | ORAL | Status: DC
  Administered 2016-09-06: 325 mg via ORAL
  Filled 2016-09-06: qty 1

## 2016-09-06 MED ORDER — LABETALOL HCL 5 MG/ML IV SOLN
20.0000 mg | Freq: Once | INTRAVENOUS | Status: AC
  Administered 2016-09-06: 20 mg via INTRAVENOUS
  Filled 2016-09-06: qty 4

## 2016-09-06 MED ORDER — SODIUM CHLORIDE 0.9% FLUSH
3.0000 mL | Freq: Two times a day (BID) | INTRAVENOUS | Status: DC
  Administered 2016-09-06: 3 mL via INTRAVENOUS

## 2016-09-06 MED ORDER — ACETAMINOPHEN 650 MG RE SUPP: 650.0000 mg | Freq: Four times a day (QID) | RECTAL | Status: DC | PRN

## 2016-09-06 NOTE — ED Provider Notes (Signed)
[facility]-EMERGENCY DEPT Provider Note   CSN: [id number] Arrival date & time: 09/05/16  2145  By signing my name below, I, Modena JanskyAlbert Thayil, attest that this documentation has been prepared under the direction and in the presence of Glynn OctaveStephen Vong Garringer, MD . Electronically Signed: Modena JanskyAlbert Thayil, Scribe. 09/06/2016. 2:05 AM.  History   Chief Complaint Chief Complaint  Patient presents with  . Hypertension   The history is provided by the patient. No language interpreter was used.   HPI Comments: Blake Gonzalez is a 40 y.o. male who presents to the Emergency Department complaining of hypertension that started today. He states he had a gradual onset of occipital headache, then he checked his BP and it was 215/130. Pt's blood pressure in the ED today was 192/131. He states he is compliant with his blood pressure medications without any recent changes. He reports associated intermittent RLQ abdominal pain. He admits to a hx of MI, cardiac catheterization, and coronary angioplasty (with stent placement). He denies any recent trauma, hx of alcohol use, decreased appetite, chest pain, back pain, vomiting, visual disturbance, dizziness, lightheadedness, or other complaints.    PCP: [facility] Past Medical History:  Diagnosis Date  . Cocaine abuse    Started using after divorce; no use around time of MI  . Crohn's colitis (HCC)   . Hepatitis C    01/17/2012 labs  . Hypertension    Multiple Hospitalizations  . Kidney stones   . Myocardial infarction    [place], [place]    Patient Active Problem List   Diagnosis Date Noted  . Gastritis, acute 01/26/2012  . Epigastric abdominal pain 01/24/2012  . ARF (acute renal failure) (HCC) 01/24/2012  . Nausea and vomiting 01/24/2012  . Elevated transaminase level 01/24/2012  . Thrombocytopenia (HCC) 01/24/2012  . Crohn's colitis (HCC)   . Cocaine abuse   . Myocardial infarction   . Hypertension     Past Surgical History:  Procedure  Laterality Date  . CARDIAC CATHETERIZATION  2011  . CORONARY ANGIOPLASTY WITH STENT PLACEMENT  2011  . LITHOTRIPSY    . OTHER SURGICAL HISTORY  2011   Percutaneous biliary tube  . RENAL BIOPSY  2000  . SMALL INTESTINE SURGERY         Home Medications    Prior to Admission medications   Medication Sig Start Date End Date Taking? Authorizing Provider  aspirin 325 MG tablet Take 325 mg by mouth daily.    Historical Provider, MD  cloNIDine (CATAPRES) 0.2 MG tablet Take 1 tablet (0.2 mg total) by mouth 3 (three) times daily. 01/27/12 01/26/13  Laveda Normanhris N Oti, MD  dicyclomine (BENTYL) 20 MG tablet Take 1 tablet (20 mg total) by mouth 2 (two) times daily. 01/30/12 01/29/13  Cherrie DistanceFrances Sanford, PA-C  pantoprazole (PROTONIX) 40 MG tablet Take 1 tablet (40 mg total) by mouth daily at 6 (six) AM. 01/27/12 01/26/13  Laveda Normanhris N Oti, MD  promethazine (PHENERGAN) 25 MG tablet Take 1 tablet (25 mg total) by mouth every 6 (six) hours as needed for nausea. 01/30/12 02/06/12  Cherrie DistanceFrances Sanford, PA-C  Pseudoeph-Doxylamine-DM-APAP (NYQUIL PO) Take 30 mLs by mouth every 6 (six) hours as needed. For sleep    Historical Provider, MD    Family History Family History  Problem Relation Age of Onset  . Heart disease Father     early death at age 625  . Diabetes Maternal Aunt   . Cancer      Social History Social History  Substance Use  Topics  . Smoking status: Current Every Day Smoker    Packs/day: 0.50    Types: Cigarettes  . Smokeless tobacco: Never Used  . Alcohol use No     Allergies   Patient has no known allergies.   Review of Systems Review of Systems A complete 10 system review of systems was obtained and all systems are negative except as noted in the HPI and PMH.   Physical Exam Updated Vital Signs BP (!) 192/131 (BP Location: Right Arm)   Pulse 86   Temp 98.6 F (37 C) (Oral)   Resp 14   Ht 6' (1.829 m)   Wt 150 lb 7 oz (68.2 kg)   SpO2 97%   BMI 20.40 kg/m   Physical Exam    Constitutional: He is oriented to person, place, and time. He appears well-developed and well-nourished. No distress.  Appears anxious.   HENT:  Head: Normocephalic and atraumatic.  Mouth/Throat: Oropharynx is clear and moist. No oropharyngeal exudate.  Eyes: Conjunctivae and EOM are normal. Pupils are equal, round, and reactive to light.  Neck: Normal range of motion. Neck supple.  No meningismus.  Cardiovascular: Normal rate, regular rhythm, normal heart sounds and intact distal pulses.   No murmur heard. Intact radial and DP pulses bilaterally.   Pulmonary/Chest: Effort normal and breath sounds normal. No respiratory distress. He exhibits no tenderness.  Abdominal: Soft. There is no tenderness. There is no rebound and no guarding.  Musculoskeletal: Normal range of motion. He exhibits no edema or tenderness.  Neurological: He is alert and oriented to person, place, and time. No cranial nerve deficit. He exhibits normal muscle tone. Coordination normal.   5/5 strength throughout. CN 2-12 intact.Equal grip strength.   Skin: Skin is warm.  Psychiatric: He has a normal mood and affect. His behavior is normal.  Nursing note and vitals reviewed.    ED Treatments / Results  DIAGNOSTIC STUDIES: Oxygen Saturation is 97% on RA, normal by my interpretation.    COORDINATION OF CARE: 2:10 AM- Pt advised of plan for treatment and pt agrees.  Labs (all labs ordered are listed, but only abnormal results are displayed) Labs Reviewed  COMPREHENSIVE METABOLIC PANEL - Abnormal; Notable for the following:       Result Value   Chloride 112 (*)    CO2 21 (*)    Glucose, Bld 105 (*)    BUN 25 (*)    Creatinine, Ser 2.60 (*)    Calcium 8.8 (*)    AST 45 (*)    Alkaline Phosphatase 206 (*)    GFR calc non Af Amer 29 (*)    GFR calc Af Amer 33 (*)    All other components within normal limits  CBC - Abnormal; Notable for the following:    RDW 15.9 (*)    Platelets 77 (*)    All other  components within normal limits  URINALYSIS, ROUTINE W REFLEX MICROSCOPIC - Abnormal; Notable for the following:    Protein, ur 100 (*)    Squamous Epithelial / LPF 0-5 (*)    All other components within normal limits  TROPONIN I - Abnormal; Notable for the following:    Troponin I 0.10 (*)    All other components within normal limits  TROPONIN I - Abnormal; Notable for the following:    Troponin I 0.10 (*)    All other components within normal limits  TROPONIN I - Abnormal; Notable for the following:    Troponin I 0.10 (*)  All other components within normal limits  MRSA PCR SCREENING  RAPID URINE DRUG SCREEN, HOSP PERFORMED  HCV RNA QUANT    EKG  EKG Interpretation  Date/Time:  Friday September 05 2016 22:09:17 EST Ventricular Rate:  84 PR Interval:  158 QRS Duration: 104 QT Interval:  414 QTC Calculation: 489 R Axis:   -5 Text Interpretation:  Normal sinus rhythm Possible Left atrial enlargement Left ventricular hypertrophy with repolarization abnormality Prolonged QT Abnormal ECG No significant change was found Confirmed by Manus Gunning  MD, Jarreau Callanan [phone]) on 09/06/2016 4:47:31 AM       Radiology Dg Chest 2 View  Result Date: 09/05/2016 CLINICAL DATA:  40 year old male high blood pressure and headache. History of AAA stent 9 months ago. EXAM: CHEST  2 VIEW COMPARISON:  Chest radiograph dated 12/06/2015 FINDINGS: There is an 8 mm right upper lobe nodule which appears similar to the prior radiograph. A 1 cm peripheral nodular density in the right lower lung field also appears similar to prior radiograph. There is no focal consolidation, pleural effusion, or pneumothorax. Stable mild cardiomegaly. There is endovascular stent graft repair of a thoracic aortic aneurysm. There is aneurysmal dilatation of the aortic arch extending beyond the confines of the graft. The osseous structures appear unremarkable. IMPRESSION: No acute cardiopulmonary process. Stable mild cardiomegaly.  Endovascular stent graft repair of thoracic aortic aneurysm. There is aneurysmal dilatation of the aortic arch extending beyond the confines of the graft material. Right lung nodule similar to the prior radiograph. Electronically Signed   By: Elgie Collard M.D.   On: 09/05/2016 23:17    Procedures Procedures (including critical care time)  Medications Ordered in ED Medications  labetalol (NORMODYNE,TRANDATE) injection 20 mg (not administered)     Initial Impression / Assessment and Plan / ED Course  I have reviewed the triage vital signs and the nursing notes.  Pertinent labs & imaging results that were available during my care of the patient were reviewed by me and considered in my medical decision making (see chart for details).  Clinical Course   Patient presents with gradual onset headache for the past day associated with elevated blood pressure. States compliance with his medications. Patient with history of difficult to control blood pressure. He underwent aortic dissection repair at an outside hospital about 9 months ago. He denies any chest pain or back pain area  He is hypertensive. Creatinine is 2.6. The last value from 4 years ago was 2.0. New thrombocytopenia.  IV labetalol given.  BP improving to 150s/100s. CT head negative. Troponin 0.1.  No Chest pain or SOB.  EKG with LVH strain pattern, unchanged.  Plan to start IV nicardipine and admit for hypertensive emergency.  Dr. Maryfrances Bunnell requests to hold nicardipine at this time and start nitroglycerin instead. He will admit to step down. Cr was 2.4 on discharge in May.  Outside records obtained. Summary per Dr. Maryfrances Bunnell :  "He was last admitted to the hospital twice in April and then May 2017 at [facility] in [place]. 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 The second time he was transferred from [facility] a month later (in May) for chest  pain by Cardiology, CT chest showed an endoleak (evaluated by Vascular surgery, thought not to be cause of his pain), and he was treated for accelerated hypertension and discharged.    Last LHC was in April at [facility] and showed non-obstructive disease.  Echo at  that time showed EF 65%, severe concentric LVH without aortic valve gradient. "  CRITICAL CARE Performed by: Glynn Octave Total critical care time: 45 minutes Critical care time was exclusive of separately billable procedures and treating other patients. Critical care was necessary to treat or prevent imminent or life-threatening deterioration. Critical care was time spent personally by me on the following activities: development of treatment plan with patient and/or surrogate as well as nursing, discussions with consultants, evaluation of patient's response to treatment, examination of patient, obtaining history from patient or surrogate, ordering and performing treatments and interventions, ordering and review of laboratory studies, ordering and review of radiographic studies, pulse oximetry and re-evaluation of patient's condition.  Final Clinical Impressions(s) / ED Diagnoses   Final diagnoses:  Hypertensive crisis    New Prescriptions New Prescriptions   No medications on file   I personally performed the services described in this documentation, which was scribed in my presence. The recorded information has been reviewed and is accurate.    Glynn Octave, MD 09/06/16 [phone]

## 2016-09-06 NOTE — Discharge Summary (Addendum)
Physician Discharge Summary  Blake Gonzalez:[id number] DOB: September 14, 1976 DOA: 09/06/2016  PCP: Default, Provider, MD  Admit date: 09/06/2016 Discharge date: 09/11/2016  Recommendations for Outpatient Follow-up:  1. Pt left AMA  Discharge Diagnoses:  Principal Problem:   Hypertensive urgency Active Problems:   Coronary artery disease due to lipid rich plaque  Discharge Condition: unknown as pt left the unit  Diet recommendation: Pt left AM  History of present illness:  40 y.o. male with a past medical history significant for CAD s/p PCI, HOCM, CKD III baseline Cr 2.2, type II thoracic aneurysm [facility]/p [facility] and hypertension who presented with accelerated HTN. The patient moved to [place] 2 weeks ago, and was in his usual state of health until few days ago when he started to experience headaches that would not go away.   ED course: - BP 206/129 - Na 140, K 4.5, Cr 2.6 (baseline 2.2), WBC 4.2K, Hgb 13.6, platelets 77K - UA showed proteinuria, UDS clear - Troponin 0.1  Hospital Course:  Principal Problem:   Hypertensive urgency - unclear what triggered this flare up - BP has improved overnight but pt left the unit AMA  Active Problems:   Coronary artery disease due to lipid rich plaque - this will require follow up but unfortunately pt left AMA so this was not discussed with pt prior to his leave     Thrombocytopenia (HCC) - appears to be chronic, no active bleeding was reported this AM     Elevated troponin - secondary to demand ischemia in the setting of hypertensive urgency     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 History of AAA stent 9 months ago. EXAM: CHEST  2 VIEW COMPARISON:  Chest radiograph dated  12/06/2015 FINDINGS: There is an 8 mm right upper lobe nodule which appears similar to the prior radiograph. A 1 cm peripheral nodular density in the right lower lung field also appears similar to prior radiograph. There is no focal consolidation, pleural effusion, or pneumothorax. Stable mild cardiomegaly. There is endovascular stent graft repair of a thoracic aortic aneurysm. There is aneurysmal dilatation of the aortic arch extending beyond the confines of the graft. The osseous structures appear unremarkable. IMPRESSION: No acute cardiopulmonary process. Stable mild cardiomegaly. Endovascular stent graft repair of thoracic aortic aneurysm. There is aneurysmal dilatation of the aortic arch extending beyond the confines of the graft material. Right lung nodule similar to the prior radiograph. Electronically Signed   By: Elgie CollardArash  Radparvar M.D.   On: 09/05/2016 23:17   Ct Head Wo Contrast  Result Date: 09/06/2016 CLINICAL DATA:  Severe headache. Noncompliant with hypertension medications. EXAM: CT HEAD WITHOUT CONTRAST TECHNIQUE: Contiguous axial images were obtained from the base of the skull through the vertex without intravenous contrast. COMPARISON:  None. FINDINGS: Brain: There is no intracranial hemorrhage, mass or evidence of acute infarction. There is no extra-axial fluid collection. Gray matter and white matter appear normal. Cerebral volume is normal for age. Brainstem and posterior fossa are unremarkable. The CSF spaces appear normal. Vascular: No hyperdense vessel or unexpected calcification. Skull: Normal. Negative for fracture or focal lesion. Sinuses/Orbits: No acute finding. Other: None. IMPRESSION: Normal brain Electronically Signed   By: Rosey Bathaniel R Mitchell M.D.  On: 09/06/2016 02:58     Discharge Exam: Vitals:   09/06/16 1700 09/06/16 1800  BP: (!) 153/107 (!) 142/86  Pulse:    Resp: 15 15  Temp:     Vitals:   09/06/16 1300 09/06/16 1610 09/06/16 1700 09/06/16 1800  BP: (!)  147/105 (!) 180/109 (!) 153/107 (!) 142/86  Pulse:      Resp: 15 15 15 15   Temp:  97.7 F (36.5 C)    TempSrc:  Oral    SpO2:  99%    Weight:      Height:        General: Pt left AMA  Discharge Instructions  Allergies as of 09/06/2016      Reactions   Hydralazine Hypertension      Medication List    ASK your doctor about these medications   amLODipine 10 MG tablet Commonly known as:  NORVASC Take 10 mg by mouth daily.   aspirin 325 MG tablet Take 325 mg by mouth daily.   cloNIDine 0.2 MG tablet Commonly known as:  CATAPRES Take 1 tablet (0.2 mg total) by mouth 3 (three) times daily.   labetalol 200 MG tablet Commonly known as:  NORMODYNE Take 200 mg by mouth 2 (two) times daily.   multivitamin with minerals Tabs tablet Take 1 tablet by mouth daily.         The results of significant diagnostics from this hospitalization (including imaging, microbiology, ancillary and laboratory) are listed below for reference.     Microbiology: Recent Results (from the past 240 hour(s))  MRSA PCR Screening     Status: None   Collection Time: 09/06/16  6:35 AM  Result Value Ref Range Status   MRSA by PCR NEGATIVE NEGATIVE Final    Comment:        The GeneXpert MRSA Assay (FDA approved for NASAL specimens only), is one component of a comprehensive MRSA colonization surveillance program. It is not intended to diagnose MRSA infection nor to guide or monitor treatment for MRSA infections.      Labs: Basic Metabolic Panel:  Recent Labs Lab 09/05/16 2202  NA 140  K 4.5  CL 112*  CO2 21*  GLUCOSE 105*  BUN 25*  CREATININE 2.60*  CALCIUM 8.8*   Liver Function Tests:  Recent Labs Lab 09/05/16 2202  AST 45*  ALT 29  ALKPHOS 206*  BILITOT 0.7  PROT 7.2  ALBUMIN 3.6   CBC:  Recent Labs Lab 09/05/16 2202  WBC 4.2  HGB 13.6  HCT 42.6  MCV 90.6  PLT 77*   Cardiac Enzymes:  Recent Labs Lab 09/06/16 0220 09/06/16 0549 09/06/16 0734   TROPONINI 0.10* 0.10* 0.10*   SIGNED: Time coordinating discharge:   Debbora PrestoMAGICK-Tiane Szydlowski, MD  [facility] 09/11/2016, 10:01 AM Pager [phone]  If 7PM-7AM, please contact night-coverage [email or web address] Password [email or web address]

## 2016-09-06 NOTE — Progress Notes (Signed)
Pt has left AMA. Took his IV out and leads off and left the room between 18.00 and 1830. Shontelle Muska Josephina Gipolleran Rn, BSN, MSN. 09/06/2016

## 2016-09-06 NOTE — H&P (Signed)
History and Physical  Patient Name: Blake Gonzalez     ZOX:[id number]    DOB: 07-07-1976    DOA: 09/06/2016 PCP: Default, Provider, MD [facility]  Patient coming from: Home  Chief Complaint: Headache, Hypertension  HPI: Blake Gonzalez is a 40 y.o. male with a past medical history significant for CAD s/p PCI, HOCM, CKD III baseline Cr 2.2, type II thoracic aneurysm s/p endograft at [facility] in Apr 2017 and hypertension who presents with accelerated HTN.  The patient moved to [place] 2 weeks ago, and was in his usual state of health other than some head cold until today when he had slow onset headache, and then checked his blood pressure was 230/130 so he came to the ER.   He has been taking his blood pressure medicines as prescribed.  He has NOT been taking cold medicine or cocaine.  He has not been using alcohol or opiates recently, nor withdrawing.  He denies chest pain, shortness of breath, palpitations, orthopnea, nocturnal dyspnea, leg swelling.  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  Last lived in [place], where his PCP is.  He moved to [place] 2 weeks ago, lives here with a friend, plans to stay.  He was last admitted to the hospital twice in April and then May 2017 at [facility] in [place]. 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 The second time he was transferred  from [facility] a month later (in May) for chest pain by Cardiology, CT chest showed an endoleak (evaluated by Vascular surgery, thought not to be cause of his pain), and he was treated for accelerated hypertension and discharged.    Last LHC was in April at [facility] and showed non-obstructive disease.  Echo at that time showed EF 65%, severe concentric LVH without aortic valve gradient.    He has a remote history of hepatitis C, stated in some of his notes as treated, although no Hep C RNA is documented.  He has chronic kidney disease, baseline Cr 2.2, last was 2.4 at discharge from [facility] in May.           ROS: Review of Systems  HENT: Positive for congestion.   Eyes: Positive for photophobia.  Respiratory: Negative for shortness of breath.   Cardiovascular: Negative for chest pain, palpitations, orthopnea, leg swelling and PND.  Neurological: Positive for headaches. Negative for dizziness, tingling, sensory change, speech change, focal weakness, seizures and loss of consciousness.  All other systems reviewed and are negative.         Past Medical History:  Diagnosis Date  . Cocaine abuse    Started using after divorce; no use around time of MI  . Crohn's colitis (HCC)   . Hepatitis C    01/17/2012 labs  . Hypertension    Multiple Hospitalizations  . Kidney stones   . Myocardial infarction    In [place], [place]  Past Surgical History:  Procedure Laterality Date  . CARDIAC CATHETERIZATION  2011  . CORONARY ANGIOPLASTY WITH STENT PLACEMENT  2011  . LITHOTRIPSY    . OTHER SURGICAL HISTORY  2011   Percutaneous biliary tube  . RENAL BIOPSY  2000  . SMALL INTESTINE SURGERY    . THORACIC AORTIC ANEURYSM REPAIR  12/2015   At [facility]    Social History: Patient lives with his friend.  The patient walks unassisted.  Smokes.  Denies illicit drugs.  Works as Sport and exercise psychologist, recently moved to [place] permanently he hopes.  Allergies  Allergen Reactions  . Hydralazine  Hypertension    Family history: family history includes Diabetes in his maternal aunt; Heart disease in his father.  Prior to Admission medications   Medication Sig Start Date End Date Taking? Authorizing Provider  aspirin 325 MG tablet Take 325 mg by mouth daily.    Historical Provider, MD  cloNIDine (CATAPRES) 0.2 MG tablet Take 1 tablet (0.2 mg total) by mouth 3 (three) times daily. 01/27/12 01/26/13  Laveda Norman, MD  dicyclomine (BENTYL) 20 MG tablet Take 1 tablet (20 mg total) by mouth 2 (two) times daily. 01/30/12 01/29/13  Cherrie Distance, PA-C  pantoprazole (PROTONIX) 40 MG tablet Take 1 tablet (40 mg total) by mouth daily at 6 (six) AM. 01/27/12 01/26/13  Laveda Norman, MD  promethazine (PHENERGAN) 25 MG tablet Take 1 tablet (25 mg total) by mouth every 6 (six) hours as needed for nausea. 01/30/12 02/06/12  Cherrie Distance, PA-C  Pseudoeph-Doxylamine-DM-APAP (NYQUIL PO) Take 30 mLs by mouth every 6 (six) hours as needed. For sleep    Historical Provider, MD       Physical Exam: BP (!) 170/110   Pulse 76   Temp 98.6 F (37 C) (Oral)   Resp 19   Ht 6' (1.829 m)   Wt 68.2 kg (150 lb 7 oz)   SpO2 98%   BMI 20.40 kg/m  General appearance: Thin adult male, alert and in no acute distress, appears to have headache/photophobia.   Eyes: Anicteric, conjunctiva pink, lids and lashes normal. PERRL.    ENT: No nasal deformity, discharge, epistaxis.  Hearing normal. OP moist without lesions.  Edentulous. Neck: No neck masses.  Trachea midline.  No thyromegaly/tenderness. Lymph: No cervical or supraclavicular lymphadenopathy. Skin: Warm and dry.  No jaundice.  No suspicious rashes or lesions. Cardiac: RRR, nl S1-S2, S4.  Capillary refill is brisk.  JVP normal.  No LE edema.  Radial and DP pulses 2+ and symmetric. Respiratory: Normal respiratory rate and rhythm.  CTAB without rales or wheezes. Abdomen: Abdomen soft.  No TTP. No ascites, distension, hepatosplenomegaly.   MSK: No deformities or  effusions.  No cyanosis or clubbing. Neuro: Cranial nerves 3-12 intact.  Sensation intact to light touch. Speech is fluent.  Muscle strength normal and symmetric.  FTN normal.  Oriented and recall appears normal.    Psych: Sensorium intact and responding to questions, attention normal.  Behavior appropriate.  Affect normal.  Judgment and insight appear normal.     Labs on Admission:  I have personally reviewed following labs and imaging studies: CBC:  Recent Labs Lab 09/05/16 2202  WBC 4.2  HGB 13.6  HCT 42.6  MCV 90.6  PLT 77*   Basic Metabolic Panel:  Recent Labs Lab 09/05/16 2202  NA 140  K 4.5  CL 112*  CO2 21*  GLUCOSE 105*  BUN 25*  CREATININE 2.60*  CALCIUM 8.8*   GFR:  Estimated Creatinine Clearance: 35.7 mL/min (by C-G formula based on SCr of 2.6 mg/dL (H)).  Liver Function Tests:  Recent Labs Lab 09/05/16 2202  AST 45*  ALT 29  ALKPHOS 206*  BILITOT 0.7  PROT 7.2  ALBUMIN 3.6   No results for input(s): LIPASE, AMYLASE in the last 168 hours. No results for input(s): AMMONIA in the last 168 hours. Coagulation Profile: No results for input(s): INR, PROTIME in the last 168 hours. Cardiac Enzymes:  Recent Labs Lab 09/06/16 0220  TROPONINI 0.10*   BNP (last 3 results) No results for input(s): PROBNP in the last 8760 hours. HbA1C: No results for input(s): HGBA1C in the last 72 hours. CBG: No results for input(s): GLUCAP in the last 168 hours. Lipid Profile: No results for input(s): CHOL, HDL, LDLCALC, TRIG, CHOLHDL, LDLDIRECT in the last 72 hours. Thyroid Function Tests: No results for input(s): TSH, T4TOTAL, FREET4, T3FREE, THYROIDAB in the last 72 hours. Anemia Panel: No results for input(s): VITAMINB12, FOLATE, FERRITIN, TIBC, IRON, RETICCTPCT in the last 72 hours. Sepsis Labs: Invalid input(s): PROCALCITONIN, LACTICIDVEN No results found for this or any previous visit (from the past 240 hour(s)).       Radiological Exams on  Admission: Personally reviewed CXR shows cardiomegaly, no edema; CT head report reviewed: Dg Chest 2 View  Result Date: 09/05/2016 CLINICAL DATA:  40 year old male high blood pressure and headache. History of AAA stent 9 months ago. EXAM: CHEST  2 VIEW COMPARISON:  Chest radiograph dated 12/06/2015 FINDINGS: There is an 8 mm right upper lobe nodule which appears similar to the prior radiograph. A 1 cm peripheral nodular density in the right lower lung field also appears similar to prior radiograph. There is no focal consolidation, pleural effusion, or pneumothorax. Stable mild cardiomegaly. There is endovascular stent graft repair of a thoracic aortic aneurysm. There is aneurysmal dilatation of the aortic arch extending beyond the confines of the graft. The osseous structures appear unremarkable. IMPRESSION: No acute cardiopulmonary process. Stable mild cardiomegaly. Endovascular stent graft repair of thoracic aortic aneurysm. There is aneurysmal dilatation of the aortic arch extending beyond the confines of the graft material. Right lung nodule similar to the prior radiograph. Electronically Signed   By: Elgie CollardArash  Radparvar M.D.   On: 09/05/2016 23:17   Ct Head Wo Contrast  Result Date: 09/06/2016 CLINICAL DATA:  Severe headache. Noncompliant with hypertension medications. EXAM: CT HEAD WITHOUT CONTRAST TECHNIQUE: Contiguous axial images were obtained from the base of the skull through the vertex without intravenous contrast. COMPARISON:  None. FINDINGS: Brain: There is no intracranial hemorrhage, mass or evidence of acute infarction. There is no extra-axial fluid collection. Gray matter and white matter appear normal. Cerebral volume is normal for age. Brainstem and posterior fossa are unremarkable. The CSF spaces appear normal. Vascular: No hyperdense vessel or unexpected calcification. Skull: Normal. Negative for fracture or focal lesion. Sinuses/Orbits: No acute finding. Other: None. IMPRESSION: Normal  brain Electronically Signed   By: Ellery Plunkaniel R Mitchell M.D.   On: 09/06/2016 02:58    EKG: Independently reviewed. Rate 84, QTc 489, LVH with strain pattern.    Assessment/Plan  1. Hypertensive urgency:  No CHF, dyspnea, chest pain, renal failure.  Unclear precipitant.   -Nitroglycerin gtt was started in the ED, will wean off as orals are restarted and troponin delta checked -Restart oral clonidine, amlodipine, labetalol   2. Elevated troponin:  In setting of CKD, HOCM, hypertensive urgency. -Trend enzymes -Consult to Cardiology, appreciate cares  3. CAD secondary prevention:  Not taking his statin. -Continue aspirin 325, BB  4. CKD III:  Stable at baseline 2.2-2.4. -Avoid nephrotoxins, NSAIDs -Re-establish with new PCP in [place]  5. Thrombocytopenia:  Chronic.  Unclear cause.  6. Hepatitis C:  Stated in previous notes here that patient was treated, [facility]" should be done. -Check hep C RNA       DVT prophylaxis: SCDs given platelets  Code Status: FULL  Family Communication: None present  Disposition Plan: Anticipate restart oral BP meds, trend troponins, consult to Cardiology.   Consults called: None overnight Admission status: OBS, stepdown At the point of initial evaluation, it is my clinical opinion that admission for OBSERVATION is reasonable and necessary because the patient's presenting complaints in the context of their chronic conditions represent sufficient risk of deterioration or significant morbidity to constitute reasonable grounds for close observation in the hospital setting, but that the patient may be medically stable for discharge from the hospital within 24 to 48 hours.    Medical decision making: Patient seen at 5:00 AM on 09/06/2016.  The patient was discussed with Dr. Manus Gunningancour.  What exists of the patient's chart was reviewed in depth and summarized above.  Clinical condition: BP improving, but at present on a  titratable drip for BP and awaiting troponin trend.        Alberteen SamChristopher P Ansh Fauble [facility] Pager [phone]

## 2016-09-06 NOTE — ED Notes (Signed)
Attempted PIV x2 without success

## 2016-09-06 NOTE — Progress Notes (Signed)
Dr. Izola PriceMyers informed of patient going AMA. Karsin Pesta IT sales professionalColleran RN

## 2016-09-06 NOTE — ED Notes (Signed)
Patient transported to CT 

## 2016-09-07 LAB — HCV RNA QUANT: HCV QUANT: NOT DETECTED [IU]/mL (ref >50)

## 2016-10-10 ENCOUNTER — Encounter (HOSPITAL_COMMUNITY): Payer: Self-pay | Admitting: *Deleted

## 2016-10-10 ENCOUNTER — Emergency Department (HOSPITAL_COMMUNITY): Payer: Medicaid Other

## 2016-10-10 ENCOUNTER — Emergency Department (HOSPITAL_COMMUNITY)
Admission: EM | Admit: 2016-10-10 | Discharge: 2016-10-11 | Disposition: A | Discharge status: Home / Self Care | Payer: Medicaid Other | Attending: Family Medicine | Admitting: Family Medicine

## 2016-10-10 DIAGNOSIS — Z955 Presence of coronary angioplasty implant and graft: Secondary | ICD-10-CM | POA: Diagnosis not present

## 2016-10-10 DIAGNOSIS — R0602 Shortness of breath: Secondary | ICD-10-CM | POA: Insufficient documentation

## 2016-10-10 DIAGNOSIS — I129 Hypertensive chronic kidney disease with stage 1 through stage 4 chronic kidney disease, or unspecified chronic kidney disease: Secondary | ICD-10-CM | POA: Insufficient documentation

## 2016-10-10 DIAGNOSIS — N183 Chronic kidney disease, stage 3 (moderate): Secondary | ICD-10-CM | POA: Insufficient documentation

## 2016-10-10 DIAGNOSIS — I252 Old myocardial infarction: Secondary | ICD-10-CM | POA: Diagnosis not present

## 2016-10-10 DIAGNOSIS — Z79899 Other long term (current) drug therapy: Secondary | ICD-10-CM | POA: Insufficient documentation

## 2016-10-10 DIAGNOSIS — R918 Other nonspecific abnormal finding of lung field: Secondary | ICD-10-CM

## 2016-10-10 DIAGNOSIS — R935 Abnormal findings on diagnostic imaging of other abdominal regions, including retroperitoneum: Secondary | ICD-10-CM | POA: Insufficient documentation

## 2016-10-10 DIAGNOSIS — F17210 Nicotine dependence, cigarettes, uncomplicated: Secondary | ICD-10-CM | POA: Insufficient documentation

## 2016-10-10 DIAGNOSIS — Z7982 Long term (current) use of aspirin: Secondary | ICD-10-CM | POA: Diagnosis not present

## 2016-10-10 DIAGNOSIS — R079 Chest pain, unspecified: Secondary | ICD-10-CM | POA: Diagnosis present

## 2016-10-10 DIAGNOSIS — R0789 Other chest pain: Secondary | ICD-10-CM | POA: Insufficient documentation

## 2016-10-10 LAB — I-STAT TROPONIN, ED: Troponin i, poc: 0.07 ng/mL (ref 0.00–0.08)

## 2016-10-10 LAB — CBC
HEMATOCRIT: 36.9 % — AB (ref 39.0–52.0)
Hemoglobin: 12.1 g/dL — ABNORMAL LOW (ref 13.0–17.0)
MCH: 29.9 pg (ref 26.0–34.0)
MCHC: 32.8 g/dL (ref 30.0–36.0)
MCV: 91.1 fL (ref 78.0–100.0)
PLATELETS: 70 10*3/uL — AB (ref 150–400)
RBC: 4.05 MIL/uL — ABNORMAL LOW (ref 4.22–5.81)
RDW: 15.6 % — ABNORMAL HIGH (ref 11.5–15.5)
WBC: 4 10*3/uL (ref 4.0–10.5)

## 2016-10-10 LAB — BASIC METABOLIC PANEL
Anion gap: 7 (ref 5–15)
BUN: 35 mg/dL — AB (ref 6–20)
CO2: 21 mmol/L — AB (ref 22–32)
CREATININE: 3.29 mg/dL — AB (ref 0.61–1.24)
Calcium: 8.4 mg/dL — ABNORMAL LOW (ref 8.9–10.3)
Chloride: 110 mmol/L (ref 101–111)
GFR calc Af Amer: 25 mL/min — ABNORMAL LOW (ref >60)
GFR calc non Af Amer: 22 mL/min — ABNORMAL LOW (ref >60)
Glucose, Bld: 98 mg/dL (ref 65–99)
POTASSIUM: 4 mmol/L (ref 3.5–5.1)
SODIUM: 138 mmol/L (ref 135–145)

## 2016-10-10 NOTE — ED Triage Notes (Signed)
Pt reports L cp with SOB x 2 days.  Pt reports short of breath is worse with exertion.  Has hx of aortic dissection, had a mesh wrapped around his aorta in [place] about a year ago.  Reports MI x 5-6 years ago-has 2 stents

## 2016-10-10 NOTE — ED Notes (Signed)
Pt to X Ray.

## 2016-10-11 ENCOUNTER — Encounter (HOSPITAL_COMMUNITY): Payer: Self-pay | Admitting: Emergency Medicine

## 2016-10-11 ENCOUNTER — Emergency Department (HOSPITAL_COMMUNITY): Payer: Medicaid Other

## 2016-10-11 ENCOUNTER — Emergency Department (HOSPITAL_BASED_OUTPATIENT_CLINIC_OR_DEPARTMENT_OTHER): Payer: Medicaid Other

## 2016-10-11 DIAGNOSIS — I7100 Dissection of unspecified site of aorta: Secondary | ICD-10-CM

## 2016-10-11 DIAGNOSIS — R0789 Other chest pain: Secondary | ICD-10-CM | POA: Diagnosis not present

## 2016-10-11 LAB — ECHOCARDIOGRAM COMPLETE
Ao-asc: 39 cm
CHL CUP MV DEC (S): 356
E/e' ratio: 20.15
EWDT: 356 ms
FS: 19 % — AB (ref 28–44)
Height: 72 in
IV/PV OW: 1.11
LA vol index: 32.6 mL/m2
LA vol: 62.5 mL
LADIAMINDEX: 2.4 cm/m2
LASIZE: 46 mm
LAVOLA4C: 51.1 mL
LEFT ATRIUM END SYS DIAM: 46 mm
LV E/e' medial: 20.15
LV PW d: 22.2 mm — AB (ref 0.6–1.1)
LV TDI E'MEDIAL: 2.72
LV e' LATERAL: 2.72 cm/s
LV sys vol index: 39 mL/m2
LVDIAVOL: 133 mL (ref 62–150)
LVDIAVOLIN: 69 mL/m2
LVEEAVG: 20.15
LVOT VTI: 22.3 cm
LVOT area: 2.54 cm2
LVOT peak grad rest: 7 mmHg
LVOTD: 18 mm
LVOTPV: 129 cm/s
LVOTSV: 57 mL
LVSYSVOL: 75 mL — AB (ref 21–61)
MV pk A vel: 82 m/s
MV pk E vel: 54.8 m/s
RV LATERAL S' VELOCITY: 12.9 cm/s
Simpson's disk: 44
Stroke v: 58 ml
TAPSE: 19.8 mm
TDI e' lateral: 2.72
Weight: 2560 oz

## 2016-10-11 LAB — I-STAT TROPONIN, ED: Troponin i, poc: 0.07 ng/mL (ref 0.00–0.08)

## 2016-10-11 LAB — RAPID URINE DRUG SCREEN, HOSP PERFORMED
AMPHETAMINES: NOT DETECTED
BENZODIAZEPINES: NOT DETECTED
Barbiturates: NOT DETECTED
Cocaine: NOT DETECTED
Opiates: NOT DETECTED
Tetrahydrocannabinol: NOT DETECTED

## 2016-10-11 LAB — HEPATIC FUNCTION PANEL
ALBUMIN: 3.3 g/dL — AB (ref 3.5–5.0)
ALT: 18 U/L (ref 17–63)
AST: 35 U/L (ref 15–41)
Alkaline Phosphatase: 162 U/L — ABNORMAL HIGH (ref 38–126)
BILIRUBIN DIRECT: 0.1 mg/dL (ref 0.1–0.5)
Indirect Bilirubin: 0.3 mg/dL (ref 0.3–0.9)
Total Bilirubin: 0.4 mg/dL (ref 0.3–1.2)
Total Protein: 6.2 g/dL — ABNORMAL LOW (ref 6.5–8.1)

## 2016-10-11 LAB — LIPASE, BLOOD: LIPASE: 39 U/L (ref 11–51)

## 2016-10-11 MED ORDER — FENTANYL CITRATE (PF) 100 MCG/2ML IJ SOLN
100.0000 ug | INTRAMUSCULAR | Status: DC | PRN
  Administered 2016-10-11: 100 ug via INTRAVENOUS
  Filled 2016-10-11: qty 2

## 2016-10-11 MED ORDER — FENTANYL CITRATE (PF) 100 MCG/2ML IJ SOLN
100.0000 ug | Freq: Once | INTRAMUSCULAR | Status: AC
  Administered 2016-10-11: 100 ug via INTRAVENOUS
  Filled 2016-10-11: qty 2

## 2016-10-11 MED ORDER — HYDROMORPHONE HCL 1 MG/ML IJ SOLN
1.0000 mg | INTRAMUSCULAR | Status: DC | PRN
  Administered 2016-10-11 (×4): 1 mg via INTRAVENOUS
  Filled 2016-10-11 (×4): qty 1

## 2016-10-11 MED ORDER — IPRATROPIUM-ALBUTEROL 0.5-2.5 (3) MG/3ML IN SOLN: 3.0000 mL | RESPIRATORY_TRACT | Status: DC | PRN

## 2016-10-11 MED ORDER — HYDROMORPHONE HCL 1 MG/ML IJ SOLN
1.0000 mg | Freq: Once | INTRAMUSCULAR | Status: AC
  Administered 2016-10-11: 1 mg via INTRAVENOUS
  Filled 2016-10-11: qty 1

## 2016-10-11 MED ORDER — IPRATROPIUM-ALBUTEROL 0.5-2.5 (3) MG/3ML IN SOLN
3.0000 mL | RESPIRATORY_TRACT | Status: DC
  Administered 2016-10-11 (×2): 3 mL via RESPIRATORY_TRACT
  Filled 2016-10-11 (×2): qty 3

## 2016-10-11 MED ORDER — LABETALOL HCL 5 MG/ML IV SOLN
20.0000 mg | Freq: Once | INTRAVENOUS | Status: AC
Start: 2016-10-11 — End: 2016-10-11
  Administered 2016-10-11: 20 mg via INTRAVENOUS
  Filled 2016-10-11: qty 4

## 2016-10-11 MED ORDER — IOPAMIDOL (ISOVUE-300) INJECTION 61%
INTRAVENOUS | Status: AC
  Filled 2016-10-11: qty 30

## 2016-10-11 MED ORDER — HYDROMORPHONE HCL 1 MG/ML IJ SOLN: 1.0000 mg | INTRAMUSCULAR | 0 refills | Status: DC | PRN

## 2016-10-11 MED ORDER — NICARDIPINE HCL IN NACL 20-0.86 MG/200ML-% IV SOLN
3.0000 mg/h | INTRAVENOUS | Status: DC
  Administered 2016-10-11: 5 mg/h via INTRAVENOUS
  Filled 2016-10-11 (×2): qty 200

## 2016-10-11 MED ORDER — NITROGLYCERIN IN D5W 200-5 MCG/ML-% IV SOLN: 0.0000 ug/min | INTRAVENOUS | Status: AC

## 2016-10-11 MED ORDER — IOPAMIDOL (ISOVUE-300) INJECTION 61%
15.0000 mL | Freq: Once | INTRAVENOUS | Status: AC | PRN
  Administered 2016-10-11: 15 mL via ORAL

## 2016-10-11 MED ORDER — NITROGLYCERIN IN D5W 200-5 MCG/ML-% IV SOLN
0.0000 ug/min | INTRAVENOUS | Status: DC
  Administered 2016-10-11: 5 ug/min via INTRAVENOUS
  Filled 2016-10-11: qty 250

## 2016-10-11 NOTE — ED Notes (Signed)
Pt transported to CT ?

## 2016-10-11 NOTE — ED Notes (Signed)
MD aware of BP

## 2016-10-11 NOTE — ED Notes (Signed)
Echo at bedside

## 2016-10-11 NOTE — H&P (Signed)
History and Physical   Blake Riehle ZOX:[id number] DOB: 1976-04-15 DOA: 10/10/2016  Referring MD/NP/PA: Dr. Read Drivers, EDP PCP: [facility]  Patient coming from: Home  Chief Complaint: Chest pain  HPI: Blake Gonzalez is a 41 y.o. 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   He reports "stabbing", severe, constant, waxing/waning left-sided chest pain that started abruptly 2 days ago. He developed a nonproductive cough 6 days ago which has continued and worsens the chest pain, and notes new dyspnea on exertion. He becomes winded and has to sit after traversing a single flight of stairs. He's been taking BP medications as directed with home BP ranging in 160-170's/100-110's, denying leg swelling, orthopnea, PND. Symptoms not improved with tylenol at home but better with dilaudid here. Denies any recent cocaine use.   In the ED he was afebrile with BP 202/137 improved with IV labetalol to 175/108, in no respiratory distress. WBC 4.0, troponin 0.07 x2, ECG stable from prior with LVH and repolarization abnormalities. CT chest performed without contrast due to creatinine of 3.29 showed aneurysmal dilatation of the aortic isthmus and descending aorta with endovascular stent graft. Extension of the aortic isthmus was noted beyond the graft with suspected partially calcified mural thrombus.   He also notes moderate, cramping LUQ abdominal pain for the past several weeks that does not radiate. Denies trauma. CT abdomen showed increased splenomegaly to 16cm and changes of hepatic cirrhosis. +loose stools over past few days. None in past 12 hours. Has occasional global HA radiating from neck without nuchal rigidity. Denies fevers or sick contacts.    Review of Systems: All other systems reviewed and are negative.   Past Medical  History:  Diagnosis Date  . CKD (chronic kidney disease), stage III   . Cocaine abuse    Started using after divorce; no use around time of MI  . Crohn's colitis (HCC)    Currently denies  . Hepatitis C    01/17/2012 labs  . HOCM (hypertrophic obstructive cardiomyopathy) (HCC)   . Hypertension    Multiple Hospitalizations  . Kidney stones   . Myocardial infarction    In [place], [place]  . Type 2 dissection of thoracic aorta [facility])     Past Surgical History:  Procedure Laterality Date  . CARDIAC CATHETERIZATION  2011  . CORONARY ANGIOPLASTY WITH STENT PLACEMENT  2011  . LITHOTRIPSY    . OTHER SURGICAL HISTORY  2011   Percutaneous biliary tube  . RENAL BIOPSY  2000  . SMALL INTESTINE SURGERY    . 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 Divorced with 2 sons, ages 60 and 50. Smokes <5 cigarettes daily. Has history of polysubstance use but denies recent use and no EtOH.  Allergies  Allergen Reactions  . Hydralazine Hypertension   Father was heavy drinker, died of MI at 36.  Mother is alive with HTN 2 siblings healthy  - Family history otherwise reviewed and not pertinent.  Prior to Admission medications   Medication Sig Start Date End Date Taking? Authorizing Provider  amLODipine (NORVASC) 10 MG tablet Take 10 mg by mouth every morning.    Yes Historical Provider, MD  aspirin 325 MG tablet Take 325  mg by mouth every morning.    Yes Historical Provider, MD  cloNIDine (CATAPRES) 0.2 MG tablet Take 1 tablet (0.2 mg total) by mouth 3 (three) times daily. Patient taking differently: Take 0.2 mg by mouth at bedtime.  01/27/12 10/10/16 Yes Laveda Norman, MD  labetalol (NORMODYNE) 200 MG tablet Take 200 mg by mouth 3 (three) times daily.    Yes Historical Provider, MD  Multiple Vitamin (MULTIVITAMIN WITH MINERALS) TABS tablet Take 1 tablet by mouth every morning.    Yes Historical  Provider, MD    Physical Exam: Vitals:   10/11/16 0619 10/11/16 0730 10/11/16 0907 10/11/16 0950  BP:  (!) 185/114 (!) 179/127 (!) 172/114  Pulse: 73 72 72 70  Resp: 16 12 10 16   Temp:    98.2 F (36.8 C)  TempSrc:    Oral  SpO2: 98% 96% 93% 94%  Weight:      Height:       Constitutional: Chronically ill-appearing 41 y.o. male in no distress, calm demeanor Eyes: Lids and conjunctivae normal, pupils constricted, reactive bilaterally ENT: Mucous membranes are moist. Posterior pharynx clear of any exudate or lesions. Poor dentition.  Neck: normal, supple, no masses, no thyromegaly Respiratory: Non-labored breathing room air without accessory muscle use. Clear breath sounds to auscultation bilaterally Cardiovascular: Regular rate and rhythm, no murmurs, rubs, or gallops. No carotid bruits. JVP at 8cm. No LE edema. 2+ pedal pulses. Abdomen: Normoactive bowel sounds. +infraumbilical midline scar and LUQ laparoscopic scars noted. Mild tenderness to LUQ deep palpation without distention. Tip of spleen palpable. GU: No indwelling catheter Musculoskeletal: No clubbing / cyanosis. No joint deformity upper and lower extremities. Good ROM, no contractures. Normal muscle tone.  Skin: Warm, dry. No rashes, wounds, no ulcers. Neurologic: CN II-XII grossly intact. Gait normal. Speech normal. No focal deficits in motor strength or sensation in all extremities.  Psychiatric: Alert and oriented x3. Normal judgment and insight. Mood euthymic with congruent affect.   Labs on Admission: I have personally reviewed following labs and imaging studies  CBC:  Recent Labs Lab 10/10/16 2310  WBC 4.0  HGB 12.1*  HCT 36.9*  MCV 91.1  PLT 70*   Basic Metabolic Panel:  Recent Labs Lab 10/10/16 2310  NA 138  K 4.0  CL 110  CO2 21*  GLUCOSE 98  BUN 35*  CREATININE 3.29*  CALCIUM 8.4*   GFR: Estimated Creatinine Clearance: 30 mL/min (by C-G formula based on SCr of 3.29 mg/dL (H)). Liver Function  Tests:  Recent Labs Lab 10/10/16 2300  AST 35  ALT 18  ALKPHOS 162*  BILITOT 0.4  PROT 6.2*  ALBUMIN 3.3*    Recent Labs Lab 10/10/16 2300  LIPASE 39   Urine analysis:    Component Value Date/Time   COLORURINE YELLOW 09/05/2016 2207   APPEARANCEUR CLEAR 09/05/2016 2207   LABSPEC 1.016 09/05/2016 2207   PHURINE 5.0 09/05/2016 2207   GLUCOSEU NEGATIVE 09/05/2016 2207   HGBUR NEGATIVE 09/05/2016 2207   BILIRUBINUR NEGATIVE 09/05/2016 2207   KETONESUR NEGATIVE 09/05/2016 2207   PROTEINUR 100 (A) 09/05/2016 2207   UROBILINOGEN 0.2 01/30/2012 0204   NITRITE NEGATIVE 09/05/2016 2207   LEUKOCYTESUR NEGATIVE 09/05/2016 2207    Radiological Exams on Admission: Ct Abdomen Pelvis Wo Contrast  Result Date: 10/11/2016 CLINICAL DATA:  41 year old male with left upper quadrant abdominal pain. EXAM: CT CHEST, ABDOMEN AND PELVIS WITHOUT CONTRAST TECHNIQUE: Multidetector CT imaging of the chest, abdomen and pelvis was performed following the standard protocol without  IV contrast. COMPARISON:  Abdominal ultrasound and CT studies dated 12/06/2015 FINDINGS: Evaluation of this exam is limited in the absence of intravenous contrast. CT CHEST FINDINGS Cardiovascular: There is mild cardiomegaly. Small pericardial effusion measuring approximately 9 mm in thickness and grossly stable since the prior CT. There is multi vessel coronary vascular disease primarily involving the LAD. A coronary stent is noted. There is aneurysmal dilatation of the aortic isthmus and descending thoracic aorta. An endovascular stent graft noted extending from the aortic arch at the takeoff of the left subclavian artery to the distal thoracic aorta. There is extension of the aortic isthmus beyond the confines of the stent graft with areas of higher density likely representing partially calcified mural thrombus. Evaluation of the aorta is limited in the absence of intravenous contrast. Mediastinum/Nodes: No hilar or mediastinal  adenopathy. Multiple small calcified lymph nodes/granuloma noted. The esophagus and the thyroid gland are grossly unremarkable. Lungs/Pleura: There is multiple scattered pulmonary nodules predominantly involving the lung bases, new since the prior CT most concerning for an infectious process, possibly with an atypical agent. Multiple small bilateral calcified granulomas seen. There is no focal consolidation or pneumothorax. Thickening of the posterior aspect of the base of the right pleura versus less likely trace pleural effusion. The central airways are patent. Musculoskeletal: Mild diffuse subcutaneous stranding and edema. No fluid collection. The osseous structures are intact. CT ABDOMEN PELVIS FINDINGS No intra-abdominal free air.  Small ascites, new since the prior CT. Hepatobiliary: There is morphologic changes of cirrhosis including caudate hypertrophy and surface irregularity. Stable 1 cm right hepatic calcified granuloma. Cholecystectomy. Pancreas: Unremarkable. No pancreatic ductal dilatation or surrounding inflammatory changes. Spleen: Splenomegaly measuring 16 cm in length, increased since the prior CT. A small scattered splenic granuloma. A small splenule is noted. Adrenals/Urinary Tract: The adrenal glands appear unremarkable. There is mild moderate bilateral renal atrophy for the patient's age. Small nonobstructing left renal calculi may be parenchymal measuring up to 4 mm in the upper pole of the left kidney. No hydronephrosis. There is no hydronephrosis or nephrolithiasis on the right. The visualized ureters and urinary bladder appear unremarkable. Stomach/Bowel: There is moderate stool throughout the colon. No evidence of bowel obstruction or active inflammation. Appendectomy. Vascular/Lymphatic: There is aneurysmal dilatation of the suprarenal abdominal aorta measuring up to 5 cm in greatest AP diameter. The infrarenal aorta measures approximately 3 cm in diameter. 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 There is a stent involving the origin of the right renal artery. Thin linear calcification in the lumen of the aorta at the level of the renal stent likely corresponds to the dissection flap. The IVC is unremarkable. No portal venous gas identified. There is no adenopathy. Reproductive: The prostate and seminal vesicles are grossly unremarkable. Other: None Musculoskeletal: No acute or significant osseous findings. IMPRESSION: Scattered bilateral pulmonary nodules primarily involving the lung bases and new since the prior CT concerning for an atypical pneumonia. Clinical correlation recommended. Multiple calcified pulmonary granuloma noted. Cardiomegaly with coronary vascular calcification and stent. Small pericardial effusion appears grossly similar to prior CT. Diffuse dilatation of the descending thoracic and abdominal aorta compatible with known type B dissection. An endovascular stent graft is seen in the descending thoracic aorta. The abdominal aorta measures up to 5 cm in AP diameter proximally. Evaluation of the aorta and vasculature is very limited in the absence of intravenous contrast. Cirrhosis. There is evidence of portal hypertension including ascites and  splenomegaly which are new findings since the prior CT and suggestive of decompensated cirrhosis. No evidence of bowel obstruction or active inflammation. Electronically Signed   By: Elgie Collard M.D.   On: 10/11/2016 05:16   Dg Chest 2 View  Result Date: 10/10/2016 CLINICAL DATA:  Acute onset of left-sided chest pain and shortness of breath. Initial encounter. EXAM: CHEST  2 VIEW COMPARISON:  Chest radiograph performed 09/05/2016 FINDINGS: The lungs are well-aerated. Right-sided pulmonary nodules are relatively stable, measuring 1.1 cm near the right lung apex and 8 mm at the right lung base. There is no evidence of focal opacification, pleural effusion or  pneumothorax. The heart is normal in size; a stent graft is seen along the descending thoracic aorta. Aneurysmal dilatation is again noted of the aortic arch beyond the confines of the graft material. No acute osseous abnormalities are seen. Clips are noted within the right upper quadrant, reflecting prior cholecystectomy. IMPRESSION: 1. Stable appearance to right-sided pulmonary nodules. 2. Note again made of aneurysmal dilatation of the aortic arch beyond the confines of the patient's stent graft material. 3. No acute cardiopulmonary process seen. Electronically Signed   By: Roanna Raider M.D.   On: 10/10/2016 23:01   Ct Chest Wo Contrast  Result Date: 10/11/2016 CLINICAL DATA:  41 year old male with left upper quadrant abdominal pain. EXAM: CT CHEST, ABDOMEN AND PELVIS WITHOUT CONTRAST TECHNIQUE: Multidetector CT imaging of the chest, abdomen and pelvis was performed following the standard protocol without IV contrast. COMPARISON:  Abdominal ultrasound and CT studies dated 12/06/2015 FINDINGS: Evaluation of this exam is limited in the absence of intravenous contrast. CT CHEST FINDINGS Cardiovascular: There is mild cardiomegaly. Small pericardial effusion measuring approximately 9 mm in thickness and grossly stable since the prior CT. There is multi vessel coronary vascular disease primarily involving the LAD. A coronary stent is noted. There is aneurysmal dilatation of the aortic isthmus and descending thoracic aorta. An endovascular stent graft noted extending from the aortic arch at the takeoff of the left subclavian artery to the distal thoracic aorta. There is extension of the aortic isthmus beyond the confines of the stent graft with areas of higher density likely representing partially calcified mural thrombus. Evaluation of the aorta is limited in the absence of intravenous contrast. Mediastinum/Nodes: No hilar or mediastinal adenopathy. Multiple small calcified lymph nodes/granuloma noted. The  esophagus and the thyroid gland are grossly unremarkable. Lungs/Pleura: There is multiple scattered pulmonary nodules predominantly involving the lung bases, new since the prior CT most concerning for an infectious process, possibly with an atypical agent. Multiple small bilateral calcified granulomas seen. There is no focal consolidation or pneumothorax. Thickening of the posterior aspect of the base of the right pleura versus less likely trace pleural effusion. The central airways are patent. Musculoskeletal: Mild diffuse subcutaneous stranding and edema. No fluid collection. The osseous structures are intact. CT ABDOMEN PELVIS FINDINGS No intra-abdominal free air.  Small ascites, new since the prior CT. Hepatobiliary: There is morphologic changes of cirrhosis including caudate hypertrophy and surface irregularity. Stable 1 cm right hepatic calcified granuloma. Cholecystectomy. Pancreas: Unremarkable. No pancreatic ductal dilatation or surrounding inflammatory changes. Spleen: Splenomegaly measuring 16 cm in length, increased since the prior CT. A small scattered splenic granuloma. A small splenule is noted. Adrenals/Urinary Tract: The adrenal glands appear unremarkable. There is mild moderate bilateral renal atrophy for the patient's age. Small nonobstructing left renal calculi may be parenchymal measuring up to 4 mm in the upper pole of the left kidney. No  hydronephrosis. There is no hydronephrosis or nephrolithiasis on the right. The visualized ureters and urinary bladder appear unremarkable. Stomach/Bowel: There is moderate stool throughout the colon. No evidence of bowel obstruction or active inflammation. Appendectomy. Vascular/Lymphatic: There is aneurysmal dilatation of the suprarenal abdominal aorta measuring up to 5 cm in greatest AP diameter. The infrarenal aorta measures approximately 3 cm in diameter. 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 There is a stent involving the origin of the right renal artery. Thin linear calcification in the lumen of the aorta at the level of the renal stent likely corresponds to the dissection flap. The IVC is unremarkable. No portal venous gas identified. There is no adenopathy. Reproductive: The prostate and seminal vesicles are grossly unremarkable. Other: None Musculoskeletal: No acute or significant osseous findings. IMPRESSION: Scattered bilateral pulmonary nodules primarily involving the lung bases and new since the prior CT concerning for an atypical pneumonia. Clinical correlation recommended. Multiple calcified pulmonary granuloma noted. Cardiomegaly with coronary vascular calcification and stent. Small pericardial effusion appears grossly similar to prior CT. Diffuse dilatation of the descending thoracic and abdominal aorta compatible with known type B dissection. An endovascular stent graft is seen in the descending thoracic aorta. The abdominal aorta measures up to 5 cm in AP diameter proximally. Evaluation of the aorta and vasculature is very limited in the absence of intravenous contrast. Cirrhosis. There is evidence of portal hypertension including ascites and splenomegaly which are new findings since the prior CT and suggestive of decompensated cirrhosis. No evidence of bowel obstruction or active inflammation. Electronically Signed   By: Elgie CollardArash  Radparvar M.D.   On: 10/11/2016 05:16    EKG: Independently reviewed. NSR, vent rate 77bpm. LVH with QRS duration 109msec and anterior precordial concave upward ST elevation identical to prior tracing. QRS 491msec.  Assessment/Plan Active Problems:   * No active hospital problems. *   Hypertensive Emergency:  - Started nitro gtt with target SBP 120mmHg   Aortic dissection: Known, with h/o TEVAR with endoleak. - CT chest limited by no contrast and evaluation of dissection (?propagation) further limited by no available comparison. Records  obtained from [facility] do not include radiographic measurements/interpretation and images of scans performed there and/or at OSH prior to transfer for last admission in Oct-Nov 2017 are also not able to be obtained.  - Ordered stat transthoracic echocardiogram. Will attempt to have these results sent with patient. - MRI would be limited by artifact from metallic stent - Will ensure CD with radiology images is sent with patient.  Troponin elevation: With stable ECG, troponins flat-trending at 0.07 x2. Most likely demand with severe HTN. History of diffuse CAD. - Recommend continued trending and consider cardiology consult - Continue ASA, statin, BP control as above  AKI on CKD stage III: Presumed hypertensive nephropathy and acute end-organ damage by severe HTN currently - Avoid contrast - Consider nephrology consultation.   Pulmonary nodules: Seen on CT here, and noted to be seen on CT at previous admission. Unknown stability as images are not available. Will benefit from CD images being sent with pt for transfer to [facility].  - Discussed with pulmonology here, Dr. Kendrick FriesMcQuaid, who remarked on LLL tree&bud abnormalities and chronic-appearing calcifications suspicious for granulomatous disease. These findings not felt to be acutely infectious and not explanation for chest pain.  - ?chronic aspiration  History of polysubstance abuse:  - UDS pending, but denies any recent cocaine use  Hepatitis C cirrhosis:  History of IFN Tx 11 years ago, per pt. RNA neg at last admission. Concomitant splenomegaly noted on CT with stable thrombocytopenia  Case discussed with [facility] radiologist who cannot comment on progression of dissection due to no available comparison images. [facility] unable to send images. Further discussed with intensivist at [facility], Dr. Christiana Fuchs who agrees to accept transfer to ICU for hypertensive emergency in setting of aortic dissection.  Hazeline Junker,  MD [facility] Pager [phone]

## 2016-10-11 NOTE — ED Notes (Signed)
Pt informed that he will be transferred to the ICU at [facility].  Pt states he is unsure if he wants to go d/t not having his car or family close by there to come back once his treatment is finished.  Pt encouraged to go.  It was explained to pt that it is important to go to [facility] for treatment because we are unable to see his graft d/t the inability to obtain a CT with contrast.  Pt made aware that his issues could be potentially life threatening if he didn't want to go. Pt expressed understanding and stated he is going to make some phone calls.

## 2016-10-11 NOTE — ED Notes (Signed)
[facility] called for transport. 

## 2016-10-11 NOTE — ED Notes (Signed)
[facility] transport en route.

## 2016-10-11 NOTE — ED Notes (Addendum)
Dr. Jarvis NewcomerGrunz made aware of patient's trending blood pressure.

## 2016-10-11 NOTE — Progress Notes (Signed)
  Echocardiogram 2D Echocardiogram has been performed.  Janalyn HarderWest, Aakash Hollomon R 10/11/2016, 11:29 AM

## 2016-10-11 NOTE — ED Notes (Signed)
Confirmed with [facility] that Cardene is the agent that successfully lowers pt's blood pressure. Historically, Nitroglycerine has been ineffective.

## 2016-10-11 NOTE — ED Notes (Signed)
Pt c/o flank pain. MD made aware.

## 2016-10-11 NOTE — ED Provider Notes (Signed)
[facility]-EMERGENCY DEPT Provider Note: Blake Dell, MD, FACEP  CSN: [id number] MRN: [id number] ARRIVAL: 10/10/16 at 2126 ROOM: WA24/WA24  By signing my name below, I, Blake Gonzalez, attest that this documentation has been prepared under the direction and in the presence of Blake Libra, MD. Electronically Signed: Elder Gonzalez, Scribe. 10/11/16. 1:12 AM.  CHIEF COMPLAINT  Chest Pain   HISTORY OF PRESENT ILLNESS  Blake Gonzalez is a 41 y.o. male with history of HTN and CAD requiring coronary angioplasty with stenting in 2011 with an additional procedure in 2017 at [facility] who presents to the ED for evaluation of chest pain and dyspnea. This patient states that in the last 3 days he has developed severe, L sided chest pain with associated dyspnea on exertion which resolves while at rest. He is also reporting diffuse LUQ pain/ L lateral abdominal wall pain/ L rib pain which is worse on palpation. These symptoms are usual for him. He is also reporting mild nausea and dry cough with occasional blurred vision in the last 2 days. He denies any fevers. He denies any history of respiratory disease. He denies any vomiting or fever. He denies any trauma.   Past Medical History:  Diagnosis Date  . CKD (chronic kidney disease), stage III   . Cocaine abuse    Started using after divorce; no use around time of MI  . Crohn's colitis (HCC)    Currently denies  . Hepatitis C    01/17/2012 labs  . HOCM (hypertrophic obstructive cardiomyopathy) (HCC)   . Hypertension    Multiple Hospitalizations  . Kidney stones   . Myocardial infarction    In [place], [place]  . 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 CARDIAC CATHETERIZATION  2011  . CORONARY ANGIOPLASTY WITH STENT PLACEMENT  2011  . LITHOTRIPSY    . OTHER SURGICAL HISTORY  2011   Percutaneous biliary tube  . RENAL BIOPSY  2000  . SMALL INTESTINE SURGERY    . 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 Heart disease Father     early death at age 44  . Diabetes Maternal Aunt   . Cancer      Social History  Substance Use Topics  . Smoking status: Current Every Day Smoker    Packs/day: 0.50    Types: Cigarettes  . Smokeless tobacco: Never Used  . Alcohol use No    Prior to Admission medications   Medication Sig Start Date End Date Taking? Authorizing Provider  amLODipine (NORVASC) 10 MG tablet Take 10 mg by mouth every morning.    Yes Historical Provider, MD  aspirin 325 MG tablet Take 325 mg by mouth every morning.    Yes Historical Provider, MD  cloNIDine (CATAPRES) 0.2 MG tablet Take 1 tablet (0.2 mg total) by mouth 3 (three) times daily. Patient taking differently: Take 0.2 mg by mouth at bedtime.  01/27/12 10/10/16 Yes Laveda Norman, MD  labetalol (NORMODYNE) 200 MG tablet Take 200 mg by mouth 3 (three) times daily.    Yes Historical Provider, MD  Multiple Vitamin (MULTIVITAMIN WITH MINERALS) TABS tablet Take 1 tablet by mouth every morning.    Yes Historical Provider, MD    Allergies Hydralazine   REVIEW OF SYSTEMS  Negative except as noted here or in the History of Present Illness.  PHYSICAL EXAMINATION  Initial Vital Signs Blood pressure (!) 168/109, pulse 73, temperature 98 F (36.7 C), temperature source Oral, resp. rate 20, height 6' (1.829 m), weight 160 lb (72.6 kg), SpO2 97 %.  Examination General: Well-developed, well-nourished male in no acute distress; appears older than age of record HENT: normocephalic; atraumatic Eyes: pupils equal, round and reactive to light; extraocular muscles intact Neck: supple Heart: regular rate and rhythm; no murmurs, rubs or gallops Lungs: Mildly decreased air movement bilaterally; tenderness over the L ribs without overlying rash Abdomen: soft; nondistended; LUQ tenderness; no masses or hepatosplenomegaly; bowel sounds present Extremities: No  deformity; full range of motion; pulses normal Neurologic: Awake, alert and oriented; motor function intact in all extremities and symmetric; no facial droop Skin: Warm and dry Psychiatric: Normal mood and affect   RESULTS  Summary of this visit's results, reviewed by myself:   EKG Interpretation  Date/Time:  Friday October 10 2016 22:27:29 EST Ventricular Rate:  77 PR Interval:    QRS Duration: 106 QT Interval:  433 QTC Calculation: 491 R Axis:   8 Text Interpretation:  Sinus rhythm Left atrial enlargement LVH with secondary repolarization abnormality Anterior ST elevation, probably due to LVH Borderline prolonged QT interval Baseline wander in lead(s) V3 No significant change since last tracing Confirmed by ALLEN  MD, ANTHONY ([id number]) on 10/10/2016 10:33:01 PM      Laboratory Studies: Results for orders placed or performed during the hospital encounter of 10/10/16 (from the past 24 hour(s))  I-stat troponin, ED     Status: None   Collection Time: 10/10/16 10:44 PM  Result Value Ref Range   Troponin i, poc 0.07 0.00 - 0.08 ng/mL   Comment 3          Lipase, blood     Status: None   Collection Time: 10/10/16 11:00 PM  Result Value Ref Range   Lipase 39 11 - 51 U/L  Hepatic function panel     Status: Abnormal   Collection Time: 10/10/16 11:00 PM  Result Value Ref Range   Total Protein 6.2 (L) 6.5 - 8.1 g/dL   Albumin 3.3 (L) 3.5 - 5.0 g/dL   AST 35 15 - 41 U/L   ALT 18 17 - 63 U/L   Alkaline Phosphatase 162 (H) 38 - 126 U/L   Total Bilirubin 0.4 0.3 - 1.2 mg/dL   Bilirubin, Direct 0.1 0.1 - 0.5 mg/dL   Indirect Bilirubin 0.3 0.3 - 0.9 mg/dL  Basic metabolic panel     Status: Abnormal   Collection Time: 10/10/16 11:10 PM  Result Value Ref Range   Sodium 138 135 - 145 mmol/L   Potassium 4.0 3.5 - 5.1 mmol/L   Chloride 110 101 - 111 mmol/L   CO2 21 (L) 22 - 32 mmol/L   Glucose, Bld 98 65 - 99 mg/dL   BUN 35 (H) 6 - 20 mg/dL   Creatinine, Ser 6.043.29 (H) 0.61 - 1.24 mg/dL     Calcium 8.4 (L) 8.9 - 10.3 mg/dL   GFR calc non Af Amer 22 (L) >60 mL/min   GFR calc Af Amer 25 (L) >60 mL/min   Anion gap 7 5 - 15  CBC     Status: Abnormal   Collection Time: 10/10/16 11:10 PM  Result Value Ref Range   WBC 4.0 4.0 - 10.5 K/uL   RBC 4.05 (L) 4.22 - 5.81 MIL/uL   Hemoglobin 12.1 (L) 13.0 - 17.0 g/dL   HCT 54.036.9 (L) 98.139.0 -  52.0 %   MCV 91.1 78.0 - 100.0 fL   MCH 29.9 26.0 - 34.0 pg   MCHC 32.8 30.0 - 36.0 g/dL   RDW 16.1 (H) 09.6 - 04.5 %   Platelets 70 (L) 150 - 400 K/uL  I-stat troponin, ED     Status: None   Collection Time: 10/11/16  3:02 AM  Result Value Ref Range   Troponin i, poc 0.07 0.00 - 0.08 ng/mL   Comment 3           Imaging Studies: Ct Abdomen Pelvis Wo Contrast  Result Date: 10/11/2016 CLINICAL DATA:  41 year old male with left upper quadrant abdominal pain. EXAM: CT CHEST, ABDOMEN AND PELVIS WITHOUT CONTRAST TECHNIQUE: Multidetector CT imaging of the chest, abdomen and pelvis was performed following the standard protocol without IV contrast. COMPARISON:  Abdominal ultrasound and CT studies dated 12/06/2015 FINDINGS: Evaluation of this exam is limited in the absence of intravenous contrast. CT CHEST FINDINGS Cardiovascular: There is mild cardiomegaly. Small pericardial effusion measuring approximately 9 mm in thickness and grossly stable since the prior CT. There is multi vessel coronary vascular disease primarily involving the LAD. A coronary stent is noted. There is aneurysmal dilatation of the aortic isthmus and descending thoracic aorta. An endovascular stent graft noted extending from the aortic arch at the takeoff of the left subclavian artery to the distal thoracic aorta. There is extension of the aortic isthmus beyond the confines of the stent graft with areas of higher density likely representing partially calcified mural thrombus. Evaluation of the aorta is limited in the absence of intravenous contrast. Mediastinum/Nodes: No hilar or mediastinal  adenopathy. Multiple small calcified lymph nodes/granuloma noted. The esophagus and the thyroid gland are grossly unremarkable. Lungs/Pleura: There is multiple scattered pulmonary nodules predominantly involving the lung bases, new since the prior CT most concerning for an infectious process, possibly with an atypical agent. Multiple small bilateral calcified granulomas seen. There is no focal consolidation or pneumothorax. Thickening of the posterior aspect of the base of the right pleura versus less likely trace pleural effusion. The central airways are patent. Musculoskeletal: Mild diffuse subcutaneous stranding and edema. No fluid collection. The osseous structures are intact. CT ABDOMEN PELVIS FINDINGS No intra-abdominal free air.  Small ascites, new since the prior CT. Hepatobiliary: There is morphologic changes of cirrhosis including caudate hypertrophy and surface irregularity. Stable 1 cm right hepatic calcified granuloma. Cholecystectomy. Pancreas: Unremarkable. No pancreatic ductal dilatation or surrounding inflammatory changes. Spleen: Splenomegaly measuring 16 cm in length, increased since the prior CT. A small scattered splenic granuloma. A small splenule is noted. Adrenals/Urinary Tract: The adrenal glands appear unremarkable. There is mild moderate bilateral renal atrophy for the patient's age. Small nonobstructing left renal calculi may be parenchymal measuring up to 4 mm in the upper pole of the left kidney. No hydronephrosis. There is no hydronephrosis or nephrolithiasis on the right. The visualized ureters and urinary bladder appear unremarkable. Stomach/Bowel: There is moderate stool throughout the colon. No evidence of bowel obstruction or active inflammation. Appendectomy. Vascular/Lymphatic: There is aneurysmal dilatation of the suprarenal abdominal aorta measuring up to 5 cm in greatest AP diameter. The infrarenal aorta measures approximately 3 cm in diameter. 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 There is a stent involving the origin of the right renal artery. Thin linear calcification in the lumen of the aorta at the level of the renal stent likely corresponds to the dissection flap. The  IVC is unremarkable. No portal venous gas identified. There is no adenopathy. Reproductive: The prostate and seminal vesicles are grossly unremarkable. Other: None Musculoskeletal: No acute or significant osseous findings. IMPRESSION: Scattered bilateral pulmonary nodules primarily involving the lung bases and new since the prior CT concerning for an atypical pneumonia. Clinical correlation recommended. Multiple calcified pulmonary granuloma noted. Cardiomegaly with coronary vascular calcification and stent. Small pericardial effusion appears grossly similar to prior CT. Diffuse dilatation of the descending thoracic and abdominal aorta compatible with known type B dissection. An endovascular stent graft is seen in the descending thoracic aorta. The abdominal aorta measures up to 5 cm in AP diameter proximally. Evaluation of the aorta and vasculature is very limited in the absence of intravenous contrast. Cirrhosis. There is evidence of portal hypertension including ascites and splenomegaly which are new findings since the prior CT and suggestive of decompensated cirrhosis. No evidence of bowel obstruction or active inflammation. Electronically Signed   By: Elgie Collard M.D.   On: 10/11/2016 05:16   Dg Chest 2 View  Result Date: 10/10/2016 CLINICAL DATA:  Acute onset of left-sided chest pain and shortness of breath. Initial encounter. EXAM: CHEST  2 VIEW COMPARISON:  Chest radiograph performed 09/05/2016 FINDINGS: The lungs are well-aerated. Right-sided pulmonary nodules are relatively stable, measuring 1.1 cm near the right lung apex and 8 mm at the right lung base. There is no evidence of focal opacification, pleural effusion or  pneumothorax. The heart is normal in size; a stent graft is seen along the descending thoracic aorta. Aneurysmal dilatation is again noted of the aortic arch beyond the confines of the graft material. No acute osseous abnormalities are seen. Clips are noted within the right upper quadrant, reflecting prior cholecystectomy. IMPRESSION: 1. Stable appearance to right-sided pulmonary nodules. 2. Note again made of aneurysmal dilatation of the aortic arch beyond the confines of the patient's stent graft material. 3. No acute cardiopulmonary process seen. Electronically Signed   By: Roanna Raider M.D.   On: 10/10/2016 23:01   Ct Chest Wo Contrast  Result Date: 10/11/2016 CLINICAL DATA:  41 year old male with left upper quadrant abdominal pain. EXAM: CT CHEST, ABDOMEN AND PELVIS WITHOUT CONTRAST TECHNIQUE: Multidetector CT imaging of the chest, abdomen and pelvis was performed following the standard protocol without IV contrast. COMPARISON:  Abdominal ultrasound and CT studies dated 12/06/2015 FINDINGS: Evaluation of this exam is limited in the absence of intravenous contrast. CT CHEST FINDINGS Cardiovascular: There is mild cardiomegaly. Small pericardial effusion measuring approximately 9 mm in thickness and grossly stable since the prior CT. There is multi vessel coronary vascular disease primarily involving the LAD. A coronary stent is noted. There is aneurysmal dilatation of the aortic isthmus and descending thoracic aorta. An endovascular stent graft noted extending from the aortic arch at the takeoff of the left subclavian artery to the distal thoracic aorta. There is extension of the aortic isthmus beyond the confines of the stent graft with areas of higher density likely representing partially calcified mural thrombus. Evaluation of the aorta is limited in the absence of intravenous contrast. Mediastinum/Nodes: No hilar or mediastinal adenopathy. Multiple small calcified lymph nodes/granuloma noted. The  esophagus and the thyroid gland are grossly unremarkable. Lungs/Pleura: There is multiple scattered pulmonary nodules predominantly involving the lung bases, new since the prior CT most concerning for an infectious process, possibly with an atypical agent. Multiple small bilateral calcified granulomas seen. There is no focal consolidation or pneumothorax. Thickening of the posterior aspect of the base of  the right pleura versus less likely trace pleural effusion. The central airways are patent. Musculoskeletal: Mild diffuse subcutaneous stranding and edema. No fluid collection. The osseous structures are intact. CT ABDOMEN PELVIS FINDINGS No intra-abdominal free air.  Small ascites, new since the prior CT. Hepatobiliary: There is morphologic changes of cirrhosis including caudate hypertrophy and surface irregularity. Stable 1 cm right hepatic calcified granuloma. Cholecystectomy. Pancreas: Unremarkable. No pancreatic ductal dilatation or surrounding inflammatory changes. Spleen: Splenomegaly measuring 16 cm in length, increased since the prior CT. A small scattered splenic granuloma. A small splenule is noted. Adrenals/Urinary Tract: The adrenal glands appear unremarkable. There is mild moderate bilateral renal atrophy for the patient's age. Small nonobstructing left renal calculi may be parenchymal measuring up to 4 mm in the upper pole of the left kidney. No hydronephrosis. There is no hydronephrosis or nephrolithiasis on the right. The visualized ureters and urinary bladder appear unremarkable. Stomach/Bowel: There is moderate stool throughout the colon. No evidence of bowel obstruction or active inflammation. Appendectomy. Vascular/Lymphatic: There is aneurysmal dilatation of the suprarenal abdominal aorta measuring up to 5 cm in greatest AP diameter. The infrarenal aorta measures approximately 3 cm in diameter. 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 There is a stent involving the origin of the right renal artery. Thin linear calcification in the lumen of the aorta at the level of the renal stent likely corresponds to the dissection flap. The IVC is unremarkable. No portal venous gas identified. There is no adenopathy. Reproductive: The prostate and seminal vesicles are grossly unremarkable. Other: None Musculoskeletal: No acute or significant osseous findings. IMPRESSION: Scattered bilateral pulmonary nodules primarily involving the lung bases and new since the prior CT concerning for an atypical pneumonia. Clinical correlation recommended. Multiple calcified pulmonary granuloma noted. Cardiomegaly with coronary vascular calcification and stent. Small pericardial effusion appears grossly similar to prior CT. Diffuse dilatation of the descending thoracic and abdominal aorta compatible with known type B dissection. An endovascular stent graft is seen in the descending thoracic aorta. The abdominal aorta measures up to 5 cm in AP diameter proximally. Evaluation of the aorta and vasculature is very limited in the absence of intravenous contrast. Cirrhosis. There is evidence of portal hypertension including ascites and splenomegaly which are new findings since the prior CT and suggestive of decompensated cirrhosis. No evidence of bowel obstruction or active inflammation. Electronically Signed   By: Elgie Collard M.D.   On: 10/11/2016 05:16    ED COURSE  Nursing notes and initial vitals signs, including pulse oximetry, reviewed.  Vitals:   10/11/16 0430 10/11/16 0513 10/11/16 0530 10/11/16 0611  BP:  (!) 131/119 (!) 120/109 (!) 175/108  Pulse: 74 72 73   Resp: 14 15 17    Temp:      TempSrc:      SpO2: 97% 96% 97%   Weight:      Height:       6:12 AM We will have patient for further evaluation of newly found luminary nodules. The patient's symptomatology is likely associated with these findings.  PROCEDURES    ED DIAGNOSES      ICD-9-CM ICD-10-CM   1. Multiple pulmonary nodules 793.19 R91.8   2. Chest wall pain 786.52 R07.89   3. Shortness of breath 786.05 R06.02     I personally performed the services described in this documentation, which was scribed in my presence. The recorded information has been reviewed and is accurate.  Blake Libra, MD 10/11/16 [phone]

## 2016-12-03 ENCOUNTER — Encounter (HOSPITAL_COMMUNITY): Payer: Self-pay | Admitting: Emergency Medicine

## 2016-12-03 ENCOUNTER — Inpatient Hospital Stay (HOSPITAL_COMMUNITY)
Admission: EM | Admit: 2016-12-03 | Discharge: 2016-12-05 | DRG: 305 | Discharge status: Against Medical Advice | Payer: Medicaid Other | Attending: Internal Medicine | Admitting: Internal Medicine

## 2016-12-03 DIAGNOSIS — F1410 Cocaine abuse, uncomplicated: Secondary | ICD-10-CM | POA: Diagnosis present

## 2016-12-03 DIAGNOSIS — Z87442 Personal history of urinary calculi: Secondary | ICD-10-CM

## 2016-12-03 DIAGNOSIS — I10 Essential (primary) hypertension: Secondary | ICD-10-CM

## 2016-12-03 DIAGNOSIS — Z8249 Family history of ischemic heart disease and other diseases of the circulatory system: Secondary | ICD-10-CM

## 2016-12-03 DIAGNOSIS — Z955 Presence of coronary angioplasty implant and graft: Secondary | ICD-10-CM

## 2016-12-03 DIAGNOSIS — I252 Old myocardial infarction: Secondary | ICD-10-CM

## 2016-12-03 DIAGNOSIS — I160 Hypertensive urgency: Principal | ICD-10-CM | POA: Diagnosis present

## 2016-12-03 DIAGNOSIS — I129 Hypertensive chronic kidney disease with stage 1 through stage 4 chronic kidney disease, or unspecified chronic kidney disease: Secondary | ICD-10-CM | POA: Diagnosis present

## 2016-12-03 DIAGNOSIS — Z833 Family history of diabetes mellitus: Secondary | ICD-10-CM

## 2016-12-03 DIAGNOSIS — I422 Other hypertrophic cardiomyopathy: Secondary | ICD-10-CM

## 2016-12-03 DIAGNOSIS — F1210 Cannabis abuse, uncomplicated: Secondary | ICD-10-CM | POA: Diagnosis present

## 2016-12-03 DIAGNOSIS — F17210 Nicotine dependence, cigarettes, uncomplicated: Secondary | ICD-10-CM | POA: Diagnosis present

## 2016-12-03 DIAGNOSIS — D696 Thrombocytopenia, unspecified: Secondary | ICD-10-CM | POA: Diagnosis present

## 2016-12-03 DIAGNOSIS — N184 Chronic kidney disease, stage 4 (severe): Secondary | ICD-10-CM | POA: Diagnosis present

## 2016-12-03 DIAGNOSIS — Z79899 Other long term (current) drug therapy: Secondary | ICD-10-CM

## 2016-12-03 DIAGNOSIS — Z7982 Long term (current) use of aspirin: Secondary | ICD-10-CM

## 2016-12-03 LAB — URINALYSIS, ROUTINE W REFLEX MICROSCOPIC
BACTERIA UA: NONE SEEN
BILIRUBIN URINE: NEGATIVE
Glucose, UA: NEGATIVE mg/dL
KETONES UR: NEGATIVE mg/dL
LEUKOCYTES UA: NEGATIVE
Nitrite: NEGATIVE
Specific Gravity, Urine: 1.016 (ref 1.005–1.030)
pH: 5 (ref 5.0–8.0)

## 2016-12-03 LAB — COMPREHENSIVE METABOLIC PANEL
ALBUMIN: 3.9 g/dL (ref 3.5–5.0)
ALT: 19 U/L (ref 17–63)
AST: 33 U/L (ref 15–41)
Alkaline Phosphatase: 194 U/L — ABNORMAL HIGH (ref 38–126)
Anion gap: 6 (ref 5–15)
BILIRUBIN TOTAL: 0.5 mg/dL (ref 0.3–1.2)
BUN: 38 mg/dL — AB (ref 6–20)
CHLORIDE: 111 mmol/L (ref 101–111)
CO2: 23 mmol/L (ref 22–32)
Calcium: 9.1 mg/dL (ref 8.9–10.3)
Creatinine, Ser: 3.38 mg/dL — ABNORMAL HIGH (ref 0.61–1.24)
GFR calc Af Amer: 24 mL/min — ABNORMAL LOW (ref >60)
GFR calc non Af Amer: 21 mL/min — ABNORMAL LOW (ref >60)
GLUCOSE: 90 mg/dL (ref 65–99)
POTASSIUM: 4.3 mmol/L (ref 3.5–5.1)
SODIUM: 140 mmol/L (ref 135–145)
Total Protein: 7.8 g/dL (ref 6.5–8.1)

## 2016-12-03 LAB — LIPASE, BLOOD: LIPASE: 31 U/L (ref 11–51)

## 2016-12-03 MED ORDER — LIDOCAINE 5 % EX PTCH
1.0000 | MEDICATED_PATCH | CUTANEOUS | Status: DC
  Administered 2016-12-03 – 2016-12-04 (×2): 1 via TRANSDERMAL
  Filled 2016-12-03 (×2): qty 1

## 2016-12-03 NOTE — ED Triage Notes (Signed)
Pt states he has had a congested cough and sore throat for the past few days and is now having right lower abdominal pain.  States he is unsure if it is actually abdominal pain or if he pulled a muscle.

## 2016-12-03 NOTE — ED Provider Notes (Signed)
[facility]-EMERGENCY DEPT Provider Note   CSN: [id number] Arrival date & time: 12/03/16  2155  By signing my name below, I, Rosario Adie, attest that this documentation has been prepared under the direction and in the presence of [employer], PA-C.  Electronically Signed: Rosario Adie, ED Scribe. 12/03/16. 11:04 PM.   History   Chief Complaint Chief Complaint  Patient presents with  . Abdominal Pain  . Cough   The history is provided by the patient. No language interpreter was used.    HPI Comments: Blake Gonzalez is a 41 y.o. male with a PMHx of CKD stage III, crohn's colitis, renal calculi, HTN, prior MI, prior Type II dissection of thoracic aorta, and HOCM, who presents to the Emergency Department complaining of persistent, sharp right upper quadrant abdominal pain beginning this morning. He notes radiation of his pain through into his back. Per pt, he has been experiencing a productive cough and chest congestion over the past week; however, this morning he had a new onset of right upper quadrant abdominal pain. He also reports that he had several episodes of post-tussive emesis several days ago, but none since. His pain is exacerbated with coughing and deep inspirations. Pt has been taking Tylenol without relief of his pain. He also was seen at [facility] several days ago as well where they placed him on two antitussive medications which he has taken w/o relief of his cough. His son has recently been sick with similar symptoms. Pt has a PSHx to the abdomen including an appendectomy, cholecystectomy, and prior small bowel resection. Pt is a former smoker. He denies fever, hemoptysis, diarrhea, constipation, urgency, frequency, hematuria, dysuria, difficulty urinating, or any other associated symptoms.     Past Medical History:  Diagnosis Date  . CKD (chronic kidney disease), stage III   . Cocaine abuse    Started using after divorce; no use around time of MI  . Crohn's colitis (HCC)    Currently denies  . Hepatitis C    01/17/2012 labs  . HOCM (hypertrophic obstructive cardiomyopathy) (HCC)   . Hypertension    Multiple Hospitalizations  . Kidney stones   . Myocardial infarction    In [place], [place]  . Type 2 dissection of thoracic aorta [facility])    Patient Active Problem List   Diagnosis Date Noted  . Hypertensive urgency, malignant 12/04/2016  . Hypertensive urgency 09/06/2016  . Elevated troponin 09/06/2016  . 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 Hepatitis C 09/06/2016  . Hypertrophic cardiomyopathy (HCC) 09/06/2016  . Gastritis, acute 01/26/2012  . Thrombocytopenia (HCC) 01/24/2012  . Cocaine abuse   . Coronary artery disease due to lipid rich plaque   . Hypertension    Past Surgical History:  Procedure Laterality Date  . CARDIAC CATHETERIZATION  2011  . CORONARY ANGIOPLASTY WITH STENT PLACEMENT  2011  . LITHOTRIPSY    . OTHER SURGICAL HISTORY  2011   Percutaneous biliary tube  . RENAL BIOPSY  2000  . SMALL INTESTINE SURGERY    . THORACIC AORTIC ANEURYSM REPAIR  12/2015   At [facility] Medications    Prior to Admission medications   Medication Sig Start Date End Date Taking? Authorizing Provider  amLODipine (NORVASC) 10 MG tablet Take 10 mg by mouth daily.    Yes Historical Provider, MD  aspirin 325 MG tablet Take 325 mg by mouth every morning.  Yes Historical Provider, MD  cloNIDine (CATAPRES) 0.3 MG tablet Take 0.3 mg by mouth 2 (two) times daily.   Yes Historical Provider, MD  labetalol (NORMODYNE) 200 MG tablet Take 200 mg by mouth 3 (three) times daily.    Yes Historical Provider, MD  Multiple Vitamin (MULTIVITAMIN WITH MINERALS) TABS tablet Take 1 tablet by mouth every morning.    Yes Historical Provider, MD  cloNIDine (CATAPRES) 0.2 MG tablet Take 1 tablet (0.2 mg total) by mouth 3 (three) times daily. Patient taking differently: Take 0.2 mg by mouth at  bedtime.  01/27/12 10/10/16  Laveda Normanhris N Oti, MD  nitroGLYCERIN 0.2 mg/mL infusion Inject 0-200 mcg/min into the vein continuous. 10/11/16   Tyrone Nineyan B Grunz, MD   Family History Family History  Problem Relation Age of Onset  . Heart disease Father     early death at age 45  . Diabetes Maternal Aunt   . Cancer     Social History Social History  Substance Use Topics  . Smoking status: Current Every Day Smoker    Packs/day: 0.50    Types: Cigarettes  . Smokeless tobacco: Never Used  . Alcohol use No   Allergies   Hydralazine  Review of Systems Review of Systems  Constitutional: Negative for fever.  HENT: Positive for congestion.   Respiratory: Positive for cough.   Gastrointestinal: Positive for abdominal pain and vomiting ( +post-tussive). Negative for constipation and diarrhea.  Genitourinary: Negative for difficulty urinating, dysuria, frequency, hematuria and urgency.  A complete 10 system review of systems was obtained and all systems are negative except as noted in the HPI and PMH.    Physical Exam Updated Vital Signs BP (!) 168/106 (BP Location: Right Arm)   Pulse 81   Temp 98.7 F (37.1 C) (Oral)   Resp 20   Ht 6' (1.829 m)   Wt 70.3 kg   SpO2 99%   BMI 21.02 kg/m   Physical Exam  Constitutional: He is oriented to person, place, and time. He appears well-developed and well-nourished. No distress.  Nontoxic and in NAD  HENT:  Head: Normocephalic and atraumatic.  Eyes: Conjunctivae and EOM are normal. No scleral icterus.  Neck: Normal range of motion.  Pulmonary/Chest: Effort normal. No respiratory distress.  Respirations even and unlabored  Abdominal: Soft. There is tenderness.  Mild RUQ TTP. No involuntary guarding. I appreciated this to be reproducible MSK pain. No rigidity or distension.  Musculoskeletal: Normal range of motion.  Neurological: He is alert and oriented to person, place, and time.  Skin: Skin is warm and dry. No rash noted. He is not diaphoretic.  No erythema. No pallor.  Psychiatric: He has a normal mood and affect. His behavior is normal.  Nursing note and vitals reviewed.   ED Treatments / Results  DIAGNOSTIC STUDIES: Oxygen Saturation is 97% on RA, normal by my interpretation.   COORDINATION OF CARE: 11:00 PM-Discussed next steps with pt. Pt verbalized understanding and is agreeable with the plan.   Labs (all labs ordered are listed, but only abnormal results are displayed) Labs Reviewed  COMPREHENSIVE METABOLIC PANEL - Abnormal; Notable for the following:       Result Value   BUN 38 (*)    Creatinine, Ser 3.38 (*)    Alkaline Phosphatase 194 (*)    GFR calc non Af Amer 21 (*)    GFR calc Af Amer 24 (*)    All other components within normal limits  CBC - Abnormal; Notable for the  following:    Platelets 65 (*)    All other components within normal limits  URINALYSIS, ROUTINE W REFLEX MICROSCOPIC - Abnormal; Notable for the following:    Hgb urine dipstick MODERATE (*)    Protein, ur >=300 (*)    Squamous Epithelial / LPF 0-5 (*)    All other components within normal limits  LIPASE, BLOOD  RAPID URINE DRUG SCREEN, HOSP PERFORMED  HIV ANTIBODY (ROUTINE TESTING)  CBC  BASIC METABOLIC PANEL  I-STAT TROPOININ, ED    EKG  EKG Interpretation  Date/Time:  Thursday December 04 2016 01:28:56 EDT Ventricular Rate:  77 PR Interval:    QRS Duration: 108 QT Interval:  435 QTC Calculation: 493 R Axis:   47 Text Interpretation:  Sinus rhythm Left atrial enlargement LVH with secondary repolarization abnormality Anterior ST elevation, probably due to LVH Borderline prolonged QT interval Baseline wander in lead(s) I repeated from prior with appropriate lead placement. no change from prior EKGs. Confirmed by [facility] MD, PEDRO [phone]) on 12/04/2016 1:32:29 AM       Radiology Dg Chest 2 View  Result Date: 12/04/2016 CLINICAL DATA:  41 year old male with hypertension and cough EXAM: CHEST  2 VIEW COMPARISON:  Chest CT dated  10/11/2016 FINDINGS: Multiple small scattered calcified granulomas and seen on the prior CT. The lungs are otherwise clear. There is no pleural effusion or pneumothorax. The cardiac silhouette is within normal limits. An endovascular stent graft repair of the aortic arch and descending aorta noted. No acute osseous pathology identified. IMPRESSION: No active cardiopulmonary disease. Electronically Signed   By: Elgie Collard M.D.   On: 12/04/2016 01:26    Procedures Procedures   Medications Ordered in ED Medications  lidocaine (LIDODERM) 5 % 1 patch (1 patch Transdermal Patch Applied 12/03/16 2347)  nicardipine (CARDENE) 20mg  in 0.86% saline IV infusion (0.1 mg/ml) (5 mg/hr Intravenous New Bag/Given 12/04/16 0211)  amLODipine (NORVASC) tablet 10 mg (not administered)  aspirin tablet 325 mg (not administered)  cloNIDine (CATAPRES) tablet 0.3 mg (not administered)  labetalol (NORMODYNE) tablet 200 mg (not administered)  multivitamin with minerals tablet 1 tablet (not administered)  enoxaparin (LOVENOX) injection 40 mg (not administered)  sodium chloride flush (NS) 0.9 % injection 3 mL (3 mLs Intravenous Not Given 12/04/16 0225)  cloNIDine (CATAPRES) tablet 0.2 mg (0.2 mg Oral Given 12/04/16 0110)  morphine 4 MG/ML injection 4 mg (4 mg Intravenous Given 12/04/16 0209)    CRITICAL CARE Performed by: Antony Madura   Total critical care time: 40 minutes  Critical care time was exclusive of separately billable procedures and treating other patients.  Critical care was necessary to treat or prevent imminent or life-threatening deterioration.  Critical care was time spent personally by me on the following activities: development of treatment plan with patient and/or surrogate as well as nursing, discussions with consultants, evaluation of patient's response to treatment, examination of patient, obtaining history from patient or surrogate, ordering and performing treatments and interventions,  ordering and review of laboratory studies, ordering and review of radiographic studies, pulse oximetry and re-evaluation of patient's condition.   Initial Impression / Assessment and Plan / ED Course  I have reviewed the triage vital signs and the nursing notes.  Pertinent labs & imaging results that were available during my care of the patient were reviewed by me and considered in my medical decision making (see chart for details).     41 year old male presents to the emergency department for evaluation of right upper quadrant abdominal  pain. He has a history of cholecystectomy. This pain is appreciated to be musculoskeletal, from many weeks of coughing. Over ED course, it was found that patient had had blood pressure. He reports compliance with his blood pressure medications. He is currently on 200 mg labetalol 3 times a day, 0.3 mg Catapres twice a day, and Norvasc. He denies missing any of these medications. He has been using Coricidin HBP for URI symptoms. He has been prescribed Flonase by his primary doctor. Patient reports that his baseline blood pressure is approximately 170/100.  Plan to admit for further hypertensive management. Case discussed with Dr. Julian Reil who will admit.   Vitals:   12/04/16 0145 12/04/16 0200 12/04/16 0215 12/04/16 0225  BP:  (!) 241/159  (!) 168/106  Pulse:    81  Resp: 20 20 16 20   Temp:      TempSrc:      SpO2:    99%  Weight:      Height:        Final Clinical Impressions(s) / ED Diagnoses   Final diagnoses:  Malignant hypertension    New Prescriptions New Prescriptions   No medications on file    I personally performed the services described in this documentation, which was scribed in my presence. The recorded information has been reviewed and is accurate.       Antony Madura, PA-C 12/04/16 0228    Nira Conn, MD 12/05/16 [phone]

## 2016-12-04 ENCOUNTER — Encounter (HOSPITAL_COMMUNITY): Payer: Self-pay | Admitting: Emergency Medicine

## 2016-12-04 ENCOUNTER — Emergency Department (HOSPITAL_COMMUNITY): Payer: Medicaid Other

## 2016-12-04 DIAGNOSIS — I422 Other hypertrophic cardiomyopathy: Secondary | ICD-10-CM | POA: Diagnosis not present

## 2016-12-04 DIAGNOSIS — F1410 Cocaine abuse, uncomplicated: Secondary | ICD-10-CM | POA: Diagnosis present

## 2016-12-04 DIAGNOSIS — I160 Hypertensive urgency: Secondary | ICD-10-CM | POA: Diagnosis not present

## 2016-12-04 DIAGNOSIS — Z955 Presence of coronary angioplasty implant and graft: Secondary | ICD-10-CM | POA: Diagnosis not present

## 2016-12-04 DIAGNOSIS — D696 Thrombocytopenia, unspecified: Secondary | ICD-10-CM | POA: Diagnosis not present

## 2016-12-04 DIAGNOSIS — Z833 Family history of diabetes mellitus: Secondary | ICD-10-CM | POA: Diagnosis not present

## 2016-12-04 DIAGNOSIS — I129 Hypertensive chronic kidney disease with stage 1 through stage 4 chronic kidney disease, or unspecified chronic kidney disease: Secondary | ICD-10-CM | POA: Diagnosis not present

## 2016-12-04 DIAGNOSIS — N184 Chronic kidney disease, stage 4 (severe): Secondary | ICD-10-CM | POA: Diagnosis not present

## 2016-12-04 DIAGNOSIS — Z87442 Personal history of urinary calculi: Secondary | ICD-10-CM | POA: Diagnosis not present

## 2016-12-04 DIAGNOSIS — F1210 Cannabis abuse, uncomplicated: Secondary | ICD-10-CM | POA: Diagnosis present

## 2016-12-04 DIAGNOSIS — Z8249 Family history of ischemic heart disease and other diseases of the circulatory system: Secondary | ICD-10-CM | POA: Diagnosis not present

## 2016-12-04 DIAGNOSIS — Z79899 Other long term (current) drug therapy: Secondary | ICD-10-CM | POA: Diagnosis not present

## 2016-12-04 DIAGNOSIS — F17210 Nicotine dependence, cigarettes, uncomplicated: Secondary | ICD-10-CM | POA: Diagnosis not present

## 2016-12-04 DIAGNOSIS — I252 Old myocardial infarction: Secondary | ICD-10-CM | POA: Diagnosis not present

## 2016-12-04 DIAGNOSIS — Z7982 Long term (current) use of aspirin: Secondary | ICD-10-CM | POA: Diagnosis not present

## 2016-12-04 DIAGNOSIS — I10 Essential (primary) hypertension: Secondary | ICD-10-CM | POA: Diagnosis not present

## 2016-12-04 LAB — MRSA PCR SCREENING: MRSA by PCR: NEGATIVE

## 2016-12-04 LAB — CBC
HCT: 41 % (ref 39.0–52.0)
HEMATOCRIT: 38.3 % — AB (ref 39.0–52.0)
HEMOGLOBIN: 12.6 g/dL — AB (ref 13.0–17.0)
HEMOGLOBIN: 13.6 g/dL (ref 13.0–17.0)
MCH: 29.2 pg (ref 26.0–34.0)
MCH: 29.2 pg (ref 26.0–34.0)
MCHC: 32.9 g/dL (ref 30.0–36.0)
MCHC: 33.2 g/dL (ref 30.0–36.0)
MCV: 88.2 fL (ref 78.0–100.0)
MCV: 88.7 fL (ref 78.0–100.0)
Platelets: 63 10*3/uL — ABNORMAL LOW (ref 150–400)
Platelets: 65 10*3/uL — ABNORMAL LOW (ref 150–400)
RBC: 4.32 MIL/uL (ref 4.22–5.81)
RBC: 4.65 MIL/uL (ref 4.22–5.81)
RDW: 14.4 % (ref 11.5–15.5)
RDW: 14.6 % (ref 11.5–15.5)
WBC: 5.3 10*3/uL (ref 4.0–10.5)
WBC: 5.4 10*3/uL (ref 4.0–10.5)

## 2016-12-04 LAB — RAPID URINE DRUG SCREEN, HOSP PERFORMED
AMPHETAMINES: NOT DETECTED
Barbiturates: NOT DETECTED
Benzodiazepines: NOT DETECTED
COCAINE: POSITIVE — AB
OPIATES: NOT DETECTED
TETRAHYDROCANNABINOL: POSITIVE — AB

## 2016-12-04 LAB — I-STAT TROPONIN, ED: Troponin i, poc: 0.05 ng/mL (ref 0.00–0.08)

## 2016-12-04 LAB — BASIC METABOLIC PANEL
Anion gap: 6 (ref 5–15)
BUN: 38 mg/dL — AB (ref 6–20)
CO2: 21 mmol/L — AB (ref 22–32)
CREATININE: 3.4 mg/dL — AB (ref 0.61–1.24)
Calcium: 8.6 mg/dL — ABNORMAL LOW (ref 8.9–10.3)
Chloride: 110 mmol/L (ref 101–111)
GFR calc Af Amer: 24 mL/min — ABNORMAL LOW (ref >60)
GFR calc non Af Amer: 21 mL/min — ABNORMAL LOW (ref >60)
GLUCOSE: 125 mg/dL — AB (ref 65–99)
Potassium: 4 mmol/L (ref 3.5–5.1)
Sodium: 137 mmol/L (ref 135–145)

## 2016-12-04 LAB — TROPONIN I: TROPONIN I: 0.05 ng/mL — AB (ref <0.03)

## 2016-12-04 MED ORDER — PANTOPRAZOLE SODIUM 40 MG PO TBEC
40.0000 mg | DELAYED_RELEASE_TABLET | Freq: Every day | ORAL | Status: DC
  Administered 2016-12-04 – 2016-12-05 (×2): 40 mg via ORAL
  Filled 2016-12-04 (×2): qty 1

## 2016-12-04 MED ORDER — LABETALOL HCL 200 MG PO TABS
300.0000 mg | ORAL_TABLET | Freq: Three times a day (TID) | ORAL | Status: DC
  Administered 2016-12-04 (×2): 300 mg via ORAL
  Filled 2016-12-04 (×2): qty 1

## 2016-12-04 MED ORDER — CLONIDINE HCL 0.1 MG PO TABS
0.2000 mg | ORAL_TABLET | Freq: Once | ORAL | Status: AC
  Administered 2016-12-04: 0.2 mg via ORAL
  Filled 2016-12-04: qty 2

## 2016-12-04 MED ORDER — CLONIDINE HCL 0.1 MG PO TABS
0.3000 mg | ORAL_TABLET | Freq: Two times a day (BID) | ORAL | Status: DC
  Administered 2016-12-04 – 2016-12-05 (×3): 0.3 mg via ORAL
  Filled 2016-12-04 (×3): qty 3

## 2016-12-04 MED ORDER — LABETALOL HCL 200 MG PO TABS: 200.0000 mg | ORAL_TABLET | Freq: Three times a day (TID) | ORAL | Status: DC

## 2016-12-04 MED ORDER — ASPIRIN EC 81 MG PO TBEC
81.0000 mg | DELAYED_RELEASE_TABLET | Freq: Every day | ORAL | Status: DC
  Administered 2016-12-05: 81 mg via ORAL
  Filled 2016-12-04: qty 1

## 2016-12-04 MED ORDER — ACETAMINOPHEN 325 MG PO TABS
650.0000 mg | ORAL_TABLET | ORAL | Status: DC | PRN
  Administered 2016-12-04: 650 mg via ORAL
  Filled 2016-12-04: qty 2

## 2016-12-04 MED ORDER — ENOXAPARIN SODIUM 40 MG/0.4ML ~~LOC~~ SOLN: 40.0000 mg | SUBCUTANEOUS | Status: DC

## 2016-12-04 MED ORDER — SODIUM CHLORIDE 0.9% FLUSH
3.0000 mL | Freq: Two times a day (BID) | INTRAVENOUS | Status: DC
  Administered 2016-12-04 (×2): 3 mL via INTRAVENOUS

## 2016-12-04 MED ORDER — AMLODIPINE BESYLATE 5 MG PO TABS
10.0000 mg | ORAL_TABLET | Freq: Every day | ORAL | Status: DC
  Administered 2016-12-04 – 2016-12-05 (×2): 10 mg via ORAL
  Filled 2016-12-04 (×2): qty 2

## 2016-12-04 MED ORDER — BENZONATATE 100 MG PO CAPS
200.0000 mg | ORAL_CAPSULE | Freq: Three times a day (TID) | ORAL | Status: DC
  Administered 2016-12-04 – 2016-12-05 (×4): 200 mg via ORAL
  Filled 2016-12-04 (×4): qty 2

## 2016-12-04 MED ORDER — LABETALOL HCL 200 MG PO TABS
400.0000 mg | ORAL_TABLET | Freq: Three times a day (TID) | ORAL | Status: DC
  Administered 2016-12-04 – 2016-12-05 (×2): 400 mg via ORAL
  Filled 2016-12-04 (×2): qty 2

## 2016-12-04 MED ORDER — MORPHINE SULFATE (PF) 4 MG/ML IV SOLN
2.0000 mg | Freq: Once | INTRAVENOUS | Status: AC
  Administered 2016-12-04: 2 mg via INTRAVENOUS
  Filled 2016-12-04: qty 1

## 2016-12-04 MED ORDER — NICARDIPINE HCL IN NACL 20-0.86 MG/200ML-% IV SOLN
3.0000 mg/h | INTRAVENOUS | Status: DC
  Administered 2016-12-04 (×2): 5 mg/h via INTRAVENOUS
  Filled 2016-12-04 (×4): qty 200

## 2016-12-04 MED ORDER — ADULT MULTIVITAMIN W/MINERALS CH
1.0000 | ORAL_TABLET | Freq: Every day | ORAL | Status: DC
  Administered 2016-12-04 – 2016-12-05 (×2): 1 via ORAL
  Filled 2016-12-04 (×2): qty 1

## 2016-12-04 MED ORDER — IBUPROFEN 200 MG PO TABS: 600.0000 mg | ORAL_TABLET | Freq: Three times a day (TID) | ORAL | Status: DC

## 2016-12-04 MED ORDER — MORPHINE SULFATE (PF) 4 MG/ML IV SOLN
4.0000 mg | Freq: Once | INTRAVENOUS | Status: AC
  Administered 2016-12-04: 4 mg via INTRAVENOUS
  Filled 2016-12-04: qty 1

## 2016-12-04 MED ORDER — ASPIRIN 325 MG PO TABS
325.0000 mg | ORAL_TABLET | Freq: Every day | ORAL | Status: DC
  Administered 2016-12-04: 325 mg via ORAL
  Filled 2016-12-04: qty 1

## 2016-12-04 NOTE — Progress Notes (Signed)
PROGRESS NOTE    Blake Gonzalez  ZOX:[id number] DOB: 1976-08-03 DOA: 12/03/2016 PCP: Default, Provider, MD    Brief Narrative:  Blake Gonzalez is a 41 y.o. 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  Patient presents to the ED with c/o ~2 week history of dry, productive cough, and RUQ abdominal pain.  Pain occurs with cough, reproducible on palpation and worse with deep breathing.  2 antitussive meds without relief of cough.  ED Course: Despite taking all of his meds today, including an EXTRA dose of clonadine when he developed a headache this afternoon, his BPs in the ED are running 250s/150s on repeated measurements!  He has no chest pain.  He states they usually have to end up admitting him and putting him on cardene gtt in the past when BP gets this high.  UDS positive for cocaine.    Assessment & Plan:   Principal Problem:   Hypertensive urgency, malignant Active Problems:   Thrombocytopenia (HCC)   CKD stage 4 secondary to hypertension (HCC)   Hypertrophic cardiomyopathy (HCC)  #1 malignant hypertensive urgency Likely triggered by cocaine use as UDS was positive for cocaine and THC however patient strongly denies any recent use of cocaine. Patient endorses compliance with his medications. Currently on Cardene drip. Continue home regimen of Norvasc at 10 mg daily, clonidine. Increase labetalol to 300 mg 3 times daily.Try to wean off Cardene drip today.  #2 polysubstance abuse UDS was positive for cocaine and THC. Patient was counseled on cessation of polysubstance use. Patient strongly denies any recent use of cocaine.  #3 chronic kidney disease stage IV It was noted by admitting physician the patient's PCP wants patient to be evaluated by nephrology given his progressive renal failure and now with frank proteinuria as  well as possible likely management of his hypertension. We'll discuss with nephrology as to whether this needs to be done in the inpatient setting versus outpatient setting.  #4 chronic thrombocytopenia Patient with no bleeding. SCDs for DVT prophylaxis. Continue home regimen of aspirin 325 mg daily due to history of coronary artery disease.  No charge.   DVT prophylaxis: SCDs Code Status: full Family Communication: updated patient. No family present. Disposition Plan: remain in SDU while on Cardene drip.   Consultants:   none  Procedures:   Chest x-ray 12/04/2016  Antimicrobials:   none   Subjective: Patient complaining of upper abdominal pain worse with coughing which on admission was noted to be reproducible. Patient with improvement with headache. No chest pain. No shortness of breath. Patient complaining of cough. Patient denying recent cocaine use.  Objective: Vitals:   12/04/16 0759 12/04/16 0800 12/04/16 0900 12/04/16 1000  BP:  (!) 196/124 (!) 146/91 (!) 143/97  Pulse:  62 72 73  Resp:  15 15 15   Temp: 97.9 F (36.6 C)     TempSrc: Oral     SpO2:  98% 98% 98%  Weight:      Height:        Intake/Output Summary (Last 24 hours) at 12/04/16 1019 Last data filed at 12/04/16 1000  Gross per 24 hour  Intake           122.58 ml  Output              300 ml  Net          -  177.42 ml   Filed Weights   12/03/16 2223 12/04/16 0247  Weight: 70.3 kg (155 lb) 66.7 kg (147 lb 0.8 oz)    Examination:  General exam: Appears calm and comfortable  Respiratory system: Clear to auscultation. Respiratory effort normal. Cardiovascular system: S1 & S2 heard, RRR. No JVD, murmurs, rubs, gallops or clicks. No pedal edema. Gastrointestinal system: Abdomen is nondistended, soft and nontender. No organomegaly or masses felt. Normal bowel sounds heard. Central nervous system: Alert and oriented. No focal neurological deficits. Extremities: Symmetric 5 x 5 power. Skin: No  rashes, lesions or ulcers Psychiatry: Judgement and insight appear normal. Mood & affect appropriate.     Data Reviewed: I have personally reviewed following labs and imaging studies  CBC:  Recent Labs Lab 12/03/16 2331 12/04/16 0310  WBC 5.3 5.4  HGB 13.6 12.6*  HCT 41.0 38.3*  MCV 88.2 88.7  PLT 65* 63*   Basic Metabolic Panel:  Recent Labs Lab 12/03/16 2331 12/04/16 0310  NA 140 137  K 4.3 4.0  CL 111 110  CO2 23 21*  GLUCOSE 90 125*  BUN 38* 38*  CREATININE 3.38* 3.40*  CALCIUM 9.1 8.6*   GFR: Estimated Creatinine Clearance: 26.7 mL/min (A) (by C-G formula based on SCr of 3.4 mg/dL (H)). Liver Function Tests:  Recent Labs Lab 12/03/16 2331  AST 33  ALT 19  ALKPHOS 194*  BILITOT 0.5  PROT 7.8  ALBUMIN 3.9    Recent Labs Lab 12/03/16 2331  LIPASE 31   No results for input(s): AMMONIA in the last 168 hours. Coagulation Profile: No results for input(s): INR, PROTIME in the last 168 hours. Cardiac Enzymes: No results for input(s): CKTOTAL, CKMB, CKMBINDEX, TROPONINI in the last 168 hours. BNP (last 3 results) No results for input(s): PROBNP in the last 8760 hours. HbA1C: No results for input(s): HGBA1C in the last 72 hours. CBG: No results for input(s): GLUCAP in the last 168 hours. Lipid Profile: No results for input(s): CHOL, HDL, LDLCALC, TRIG, CHOLHDL, LDLDIRECT in the last 72 hours. Thyroid Function Tests: No results for input(s): TSH, T4TOTAL, FREET4, T3FREE, THYROIDAB in the last 72 hours. Anemia Panel: No results for input(s): VITAMINB12, FOLATE, FERRITIN, TIBC, IRON, RETICCTPCT in the last 72 hours. Sepsis Labs: No results for input(s): PROCALCITON, LATICACIDVEN in the last 168 hours.  Recent Results (from the past 240 hour(s))  MRSA PCR Screening     Status: None   Collection Time: 12/04/16  3:00 AM  Result Value Ref Range Status   MRSA by PCR NEGATIVE NEGATIVE Final    Comment:        The GeneXpert MRSA Assay (FDA approved for  NASAL specimens only), is one component of a comprehensive MRSA colonization surveillance program. It is not intended to diagnose MRSA infection nor to guide or monitor treatment for MRSA infections.          Radiology Studies: Dg Chest 2 View  Result Date: 12/04/2016 CLINICAL DATA:  41 year old male with hypertension and cough EXAM: CHEST  2 VIEW COMPARISON:  Chest CT dated 10/11/2016 FINDINGS: Multiple small scattered calcified granulomas and seen on the prior CT. The lungs are otherwise clear. There is no pleural effusion or pneumothorax. The cardiac silhouette is within normal limits. An endovascular stent graft repair of the aortic arch and descending aorta noted. No acute osseous pathology identified. IMPRESSION: No active cardiopulmonary disease. Electronically Signed   By: Elgie Collard M.D.   On: 12/04/2016 01:26  Scheduled Meds: . amLODipine  10 mg Oral Daily  . aspirin  325 mg Oral Daily  . benzonatate  200 mg Oral TID  . cloNIDine  0.3 mg Oral BID  . labetalol  300 mg Oral TID  . lidocaine  1 patch Transdermal Q24H  . multivitamin with minerals  1 tablet Oral Daily  . sodium chloride flush  3 mL Intravenous Q12H   Continuous Infusions: . niCARDipine 2 mg/hr (12/04/16 1000)     LOS: 0 days    Time spent: 35 minutes    Maryrose Colvin, MD [facility] Pager [phone]  If 7PM-7AM, please contact night-coverage [email or web address] Password [email or web address] 12/04/2016, 10:19 AM

## 2016-12-04 NOTE — H&P (Addendum)
History and Physical    Blake Ahlers ZOX:[id number] DOB: [birth year] DOA: 12/03/2016  PCP: Default, Provider, MD  Patient coming from: Home  I have personally briefly reviewed patient[facility]  Chief Complaint: Cough, abd pain  HPI: Blake Gonzalez is a 41 y.o. 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  Patient presents to the ED with c/o ~2 week history of dry, productive cough, and RUQ abdominal pain.  Pain occurs with cough, reproducible on palpation and worse with deep breathing.  2 antitussive meds without relief of cough.  ED Course: Despite taking all of his meds today, including an EXTRA dose of clonadine when he developed a headache this afternoon, his BPs in the ED are running 250s/150s on repeated measurements!  He has no chest pain.  He states they usually have to end up admitting him and putting him on cardene gtt in the past when BP gets this high.  Review of Systems: As per HPI otherwise 10 point review of systems negative.   Past Medical History:  Diagnosis Date  . CKD (chronic kidney disease), stage III   . Cocaine abuse    Started using after divorce; no use around time of MI  . Crohn's colitis (HCC)    Currently denies  . Hepatitis C    01/17/2012 labs  . HOCM (hypertrophic obstructive cardiomyopathy) (HCC)   . Hypertension    Multiple Hospitalizations  . Kidney stones   . Myocardial infarction    [place]  . Type 2 dissection of thoracic aorta [facility](HCC)     Past Surgical History:  Procedure Laterality Date  . CARDIAC CATHETERIZATION  2011  . CORONARY ANGIOPLASTY WITH STENT PLACEMENT  2011  . LITHOTRIPSY    . OTHER SURGICAL HISTORY  2011   Percutaneous biliary tube  . RENAL BIOPSY  2000  . SMALL INTESTINE SURGERY    . THORACIC AORTIC ANEURYSM REPAIR  12/2015    [facility]     reports that he has been smoking Cigarettes.  He has been smoking about 0.50 packs per day. He has never used smokeless tobacco. He reports that he does not drink alcohol or use drugs.  Allergies  Allergen Reactions  . Hydralazine Hypertension    Family History  Problem Relation Age of Onset  . Heart disease Father     early death at age 725  . Diabetes Maternal Aunt   . Cancer       Prior to Admission medications   Medication Sig Start Date End Date Taking? Authorizing Provider  amLODipine (NORVASC) 10 MG tablet Take 10 mg by mouth daily.    Yes Historical Provider, MD  aspirin 325 MG tablet Take 325 mg by mouth every morning.    Yes Historical Provider, MD  cloNIDine (CATAPRES) 0.3 MG tablet Take 0.3 mg by mouth 2 (two) times daily.   Yes Historical Provider, MD  labetalol (NORMODYNE) 200 MG tablet Take 200 mg by mouth 3 (three) times daily.    Yes Historical Provider, MD  Multiple Vitamin (MULTIVITAMIN WITH MINERALS) TABS tablet Take 1 tablet by mouth every morning.    Yes Historical Provider, MD  cloNIDine (CATAPRES) 0.2 MG tablet Take 1 tablet (0.2 mg total) by mouth 3 (three) times daily. Patient taking differently: Take 0.2 mg by  mouth at bedtime.  01/27/12 10/10/16  Laveda Norman, MD  nitroGLYCERIN 0.2 mg/mL infusion Inject 0-200 mcg/min into the vein continuous. 10/11/16   Tyrone Nine, MD    Physical Exam: Vitals:   12/04/16 0115 12/04/16 0130 12/04/16 0145 12/04/16 0200  BP:    (!) 241/159  Pulse: 73 79    Resp:  13 20 20   Temp:      TempSrc:      SpO2: 99% 99%    Weight:      Height:        Constitutional: NAD, calm, comfortable Eyes: PERRL, lids and conjunctivae normal ENMT: Mucous membranes are moist. Posterior pharynx clear of any exudate or lesions.Normal dentition.  Neck: normal, supple, no masses, no thyromegaly Respiratory: clear to auscultation bilaterally, no wheezing, no crackles. Normal respiratory effort. No accessory muscle use.    Cardiovascular: Regular rate and rhythm, no murmurs / rubs / gallops. No extremity edema. 2+ pedal pulses. No carotid bruits.  Abdomen: no tenderness, no masses palpated. No hepatosplenomegaly. Bowel sounds positive.  Musculoskeletal: no clubbing / cyanosis. No joint deformity upper and lower extremities. Good ROM, no contractures. Normal muscle tone.  Skin: no rashes, lesions, ulcers. No induration Neurologic: CN 2-12 grossly intact. Sensation intact, DTR normal. Strength 5/5 in all 4.  Psychiatric: Normal judgment and insight. Alert and oriented x 3. Normal mood.    Labs on Admission: I have personally reviewed following labs and imaging studies  CBC:  Recent Labs Lab 12/03/16 2331  WBC 5.3  HGB 13.6  HCT 41.0  MCV 88.2  PLT 65*   Basic Metabolic Panel:  Recent Labs Lab 12/03/16 2331  NA 140  K 4.3  CL 111  CO2 23  GLUCOSE 90  BUN 38*  CREATININE 3.38*  CALCIUM 9.1   GFR: Estimated Creatinine Clearance: 28.3 mL/min (A) (by C-G formula based on SCr of 3.38 mg/dL (H)). Liver Function Tests:  Recent Labs Lab 12/03/16 2331  AST 33  ALT 19  ALKPHOS 194*  BILITOT 0.5  PROT 7.8  ALBUMIN 3.9    Recent Labs Lab 12/03/16 2331  LIPASE 31   No results for input(s): AMMONIA in the last 168 hours. Coagulation Profile: No results for input(s): INR, PROTIME in the last 168 hours. Cardiac Enzymes: No results for input(s): CKTOTAL, CKMB, CKMBINDEX, TROPONINI in the last 168 hours. BNP (last 3 results) No results for input(s): PROBNP in the last 8760 hours. HbA1C: No results for input(s): HGBA1C in the last 72 hours. CBG: No results for input(s): GLUCAP in the last 168 hours. Lipid Profile: No results for input(s): CHOL, HDL, LDLCALC, TRIG, CHOLHDL, LDLDIRECT in the last 72 hours. Thyroid Function Tests: No results for input(s): TSH, T4TOTAL, FREET4, T3FREE, THYROIDAB in the last 72 hours. Anemia Panel: No results for input(s): VITAMINB12, FOLATE, FERRITIN,  TIBC, IRON, RETICCTPCT in the last 72 hours. Urine analysis:    Component Value Date/Time   COLORURINE YELLOW 12/03/2016 2253   APPEARANCEUR CLEAR 12/03/2016 2253   LABSPEC 1.016 12/03/2016 2253   PHURINE 5.0 12/03/2016 2253   GLUCOSEU NEGATIVE 12/03/2016 2253   HGBUR MODERATE (A) 12/03/2016 2253   BILIRUBINUR NEGATIVE 12/03/2016 2253   KETONESUR NEGATIVE 12/03/2016 2253   PROTEINUR >=300 (A) 12/03/2016 2253   UROBILINOGEN 0.2 01/30/2012 0204   NITRITE NEGATIVE 12/03/2016 2253   LEUKOCYTESUR NEGATIVE 12/03/2016 2253    Radiological Exams on Admission: Dg Chest 2 View  Result Date: 12/04/2016 CLINICAL DATA:  41 year old male with hypertension and cough  EXAM: CHEST  2 VIEW COMPARISON:  Chest CT dated 10/11/2016 FINDINGS: Multiple small scattered calcified granulomas and seen on the prior CT. The lungs are otherwise clear. There is no pleural effusion or pneumothorax. The cardiac silhouette is within normal limits. An endovascular stent graft repair of the aortic arch and descending aorta noted. No acute osseous pathology identified. IMPRESSION: No active cardiopulmonary disease. Electronically Signed   By: Elgie Collard M.D.   On: 12/04/2016 01:26    EKG: Independently reviewed.  Assessment/Plan Principal Problem:   Hypertensive urgency, malignant Active Problems:   Thrombocytopenia (HCC)   CKD stage 4 secondary to hypertension (HCC)   Hypertrophic cardiomyopathy (HCC)    1. HTN urgency - 1. Cardene gtt for control tonight 2. Malignant HTN, CKD stage 4 - 1. Continue home BP meds 2. Maybe needs to be put back on minoxidil which he used to take before moving to the area? 3. Certainly agree with his PCP that the patient warrants evaluation by nephrology and likely management of HTN by nephrology given progressive renal failure, now with frank proteinuria too. 1. Maybe restart minoxidil while in hospital? 4. Call nephrology in AM. 3. Thrombocytopenia - 1. Chronic,  stable 2. Will use SCDs for DVT ppx 3. Will leave his home med of ASA 325 (presumably on for h/o CAD) alone for now, although questionable if he really should be on this med with chronic thrombocytopenia  DVT prophylaxis: SCDs Code Status: Full Family Communication: No family in room Disposition Plan: Home after admit Consults called: None Admission status: Admit to inpatient for cardene gtt   Hillary Bow DO [facility] Pager [phone]  If 7AM-7PM, please contact day team taking care of patient [email or web address] Password [facility]  12/04/2016, 2:11 AM

## 2016-12-04 NOTE — Progress Notes (Addendum)
Of note, since admitting the patient, his UDS has come back positive at this time for cocaine.  Havent confronted patient with this information but suspect this is what caused his flare up of blood pressure this evening.

## 2016-12-04 NOTE — ED Notes (Signed)
ED Provider at bedside. 

## 2016-12-04 NOTE — Progress Notes (Signed)
CRITICAL VALUE ALERT  Critical value received:  Troponin 0.05  Date of notification:  12/04/2016  Time of notification:  1915  Critical value read back:Yes.    Nurse who received alert:  Loletta ParishJessica Yanelle Sousa RN   MD notified (1st page):  Tama GanderKatherine Schorr NP  Time of first page:  [phone]

## 2016-12-04 NOTE — ED Notes (Signed)
EKG leads adjusted and second EKG obtained

## 2016-12-04 NOTE — ED Notes (Signed)
Family at bedside. 

## 2016-12-04 NOTE — Care Management Note (Signed)
Case Management Note  Patient Details  Name: [place]had Gullick MRN: [id number] Date of Birth: 09/23/1975  Subjective/Objective:      aki elevated bun and creatine              Action/Plan:Date:  December 04, 2016 Chart reviewed for concurrent status and case management needs. Will continue to follow patient progress. Discharge Planning: following for needs Expected discharge date: [id number] Marcelle SmilingRhonda Mckinleigh Schuchart, BSN, [place], [place]   [phone]   Expected Discharge Date:                  Expected Discharge Plan:  Home/Self Care  In-House Referral:     Discharge planning Services     Post Acute Care Choice:    Choice offered to:     DME Arranged:    DME Agency:     HH Arranged:    HH Agency:     Status of Service:  In process, will continue to follow  If discussed at Long Length of Stay Meetings, dates discussed:    Additional Comments:  Golda AcreDavis, Elmus Mathes Lynn, RN 12/04/2016, 10:51 AM

## 2016-12-05 DIAGNOSIS — I10 Essential (primary) hypertension: Secondary | ICD-10-CM

## 2016-12-05 LAB — CBC
HCT: 37.2 % — ABNORMAL LOW (ref 39.0–52.0)
Hemoglobin: 12.1 g/dL — ABNORMAL LOW (ref 13.0–17.0)
MCH: 29 pg (ref 26.0–34.0)
MCHC: 32.5 g/dL (ref 30.0–36.0)
MCV: 89.2 fL (ref 78.0–100.0)
Platelets: 58 10*3/uL — ABNORMAL LOW (ref 150–400)
RBC: 4.17 MIL/uL — ABNORMAL LOW (ref 4.22–5.81)
RDW: 14.5 % (ref 11.5–15.5)
WBC: 5 10*3/uL (ref 4.0–10.5)

## 2016-12-05 LAB — BASIC METABOLIC PANEL
Anion gap: 8 (ref 5–15)
BUN: 35 mg/dL — AB (ref 6–20)
CO2: 19 mmol/L — ABNORMAL LOW (ref 22–32)
CREATININE: 3.58 mg/dL — AB (ref 0.61–1.24)
Calcium: 8.5 mg/dL — ABNORMAL LOW (ref 8.9–10.3)
Chloride: 111 mmol/L (ref 101–111)
GFR calc non Af Amer: 20 mL/min — ABNORMAL LOW (ref >60)
GFR, EST AFRICAN AMERICAN: 23 mL/min — AB (ref >60)
Glucose, Bld: 89 mg/dL (ref 65–99)
Potassium: 4.7 mmol/L (ref 3.5–5.1)
SODIUM: 138 mmol/L (ref 135–145)

## 2016-12-05 LAB — TROPONIN I
TROPONIN I: 0.05 ng/mL — AB (ref <0.03)
TROPONIN I: 0.05 ng/mL — AB (ref <0.03)

## 2016-12-05 LAB — HIV ANTIBODY (ROUTINE TESTING W REFLEX): HIV Screen 4th Generation wRfx: NONREACTIVE

## 2016-12-05 MED ORDER — TRAMADOL HCL 50 MG PO TABS
50.0000 mg | ORAL_TABLET | Freq: Once | ORAL | Status: AC
  Administered 2016-12-05: 50 mg via ORAL
  Filled 2016-12-05: qty 1

## 2016-12-05 MED ORDER — HYDRALAZINE HCL 25 MG PO TABS: 25.0000 mg | ORAL_TABLET | Freq: Three times a day (TID) | ORAL | Status: DC

## 2016-12-05 MED ORDER — OXYCODONE-ACETAMINOPHEN 5-325 MG PO TABS
1.0000 | ORAL_TABLET | Freq: Once | ORAL | Status: AC
  Administered 2016-12-05: 1 via ORAL
  Filled 2016-12-05: qty 1

## 2016-12-05 MED ORDER — HYOSCYAMINE SULFATE 0.125 MG SL SUBL
0.2500 mg | SUBLINGUAL_TABLET | Freq: Once | SUBLINGUAL | Status: AC
  Administered 2016-12-05: 0.25 mg via SUBLINGUAL
  Filled 2016-12-05: qty 2

## 2016-12-05 MED ORDER — SODIUM BICARBONATE 650 MG PO TABS
650.0000 mg | ORAL_TABLET | Freq: Two times a day (BID) | ORAL | Status: DC
  Administered 2016-12-05: 650 mg via ORAL
  Filled 2016-12-05: qty 1

## 2016-12-05 MED ORDER — FUROSEMIDE 40 MG PO TABS
80.0000 mg | ORAL_TABLET | Freq: Two times a day (BID) | ORAL | Status: DC
  Administered 2016-12-05: 80 mg via ORAL
  Filled 2016-12-05: qty 2

## 2016-12-05 NOTE — Discharge Summary (Signed)
Physician Discharge Summary  Blake Gonzalez [id number] DOB: [birth year] DOA: 12/03/2016  PCP: Default, Provider, MD  Admit date: 12/03/2016 Discharge date: 12/05/2016  Time spent: 30 minutes   Patient left AGAINST MEDICAL ADVICE The risks of leaving AGAINST MEDICAL ADVICE was explained to patient including risk of stroke, MI, death as patient noted to have malignant hypertension/hypertensive urgency with systolic blood pressures in the 200s whenever Cardene drip was titrated off. Patient however was very insistent on leaving and subsequently left AGAINST MEDICAL ADVICE.  Recommendations for Outpatient Follow-up:  1. Patient will be contacted per nephrology for outpatient follow-up.   Discharge Diagnoses:  Principal Problem:   Hypertensive urgency, malignant Active Problems:   Thrombocytopenia (HCC)   CKD stage 4 secondary to hypertension (HCC)   Hypertrophic cardiomyopathy (HCC)   Malignant hypertension   Discharge Condition:   Diet recommendation:   Filed Weights   12/03/16 2223 12/04/16 0247  Weight: 70.3 kg (155 lb) 66.7 kg (147 lb 0.8 oz)    History of present illness:   Per Dr Gardner Blake Eckmann is a 41 y.o. 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  Patient presents to the ED with c/o ~2 week history of dry, productive cough, and RUQ abdominal pain.  Pain occurs with cough, reproducible on palpation and worse with deep breathing.  2 antitussive meds without relief of cough.  ED Course: Despite taking all of his meds today, including an EXTRA dose of clonadine when he developed a headache this afternoon, his BPs in the ED are running 250s/150s on repeated measurements!  He has no chest pain.  He states they usually have to end up admitting him and putting him on cardene gtt in the past when BP gets this  high.  [facility]   Hospital Course:     Patient left AGAINST MEDICAL ADVICE  For Hospitalization please see progress note of 12/05/2016.  Procedures:  CXR 12/04/2016  Consultations:  None  Discharge Exam: Vitals:   12/05/16 0900 12/05/16 1000  BP: (!) 178/103 (!) 210/120  Pulse: 61 64  Resp: 15 14  Temp:      General: NAD Cardiovascular: RRR Respiratory: CTAB  Discharge Instructions    Current Discharge Medication List    CONTINUE these medications which have NOT CHANGED   Details  amLODipine (NORVASC) 10 MG tablet Take 10 mg by mouth daily.     aspirin 325 MG tablet Take 325 mg by mouth every morning.     cloNIDine (CATAPRES) 0.3 MG tablet Take 0.3 mg by mouth 2 (two) times daily.    labetalol (NORMODYNE) 200 MG tablet Take 200 mg by mouth 3 (three) times daily.     Multiple Vitamin (MULTIVITAMIN WITH MINERALS) TABS tablet Take 1 tablet by mouth every morning.     nitroGLYCERIN 0.2 mg/mL infusion Inject 0-200 mcg/min into the vein continuous.       Allergies  Allergen Reactions  . Hydralazine Hypertension      The results of significant diagnostics from this hospitalization (including imaging, microbiology, ancillary and laboratory) are listed below for reference.    Significant Diagnostic Studies: Dg Chest 2 View  Result Date: 12/04/2016 CLINICAL DATA:  41 year old male with hypertension and cough EXAM: CHEST  2 VIEW COMPARISON:  Chest CT dated 10/11/2016 FINDINGS: Multiple small scattered calcified granulomas and  seen on the prior CT. The lungs are otherwise clear. There is no pleural effusion or pneumothorax. The cardiac silhouette is within normal limits. An endovascular stent graft repair of the aortic arch and descending aorta noted. No acute osseous pathology identified. IMPRESSION: No active cardiopulmonary disease. Electronically Signed   By: Elgie Collard M.D.   On: 12/04/2016 01:26    Microbiology: Recent Results  (from the past 240 hour(s))  MRSA PCR Screening     Status: None   Collection Time: 12/04/16  3:00 AM  Result Value Ref Range Status   MRSA by PCR NEGATIVE NEGATIVE Final    Comment:        The GeneXpert MRSA Assay (FDA approved for NASAL specimens only), is one component of a comprehensive MRSA colonization surveillance program. It is not intended to diagnose MRSA infection nor to guide or monitor treatment for MRSA infections.      Labs: Basic Metabolic Panel:  Recent Labs Lab 12/03/16 2331 12/04/16 0310 12/05/16 0335  NA 140 137 138  K 4.3 4.0 4.7  CL 111 110 111  CO2 23 21* 19*  GLUCOSE 90 125* 89  BUN 38* 38* 35*  CREATININE 3.38* 3.40* 3.58*  CALCIUM 9.1 8.6* 8.5*   Liver Function Tests:  Recent Labs Lab 12/03/16 2331  AST 33  ALT 19  ALKPHOS 194*  BILITOT 0.5  PROT 7.8  ALBUMIN 3.9    Recent Labs Lab 12/03/16 2331  LIPASE 31   No results for input(s): AMMONIA in the last 168 hours. CBC:  Recent Labs Lab 12/03/16 2331 12/04/16 0310 12/05/16 0335  WBC 5.3 5.4 5.0  HGB 13.6 12.6* 12.1*  HCT 41.0 38.3* 37.2*  MCV 88.2 88.7 89.2  PLT 65* 63* 58*   Cardiac Enzymes:  Recent Labs Lab 12/04/16 1816 12/04/16 2348 12/05/16 0604  TROPONINI 0.05* 0.05* 0.05*   BNP: BNP (last 3 results) No results for input(s): BNP in the last 8760 hours.  ProBNP (last 3 results) No results for input(s): PROBNP in the last 8760 hours.  CBG: No results for input(s): GLUCAP in the last 168 hours.     SignedRamiro Harvest MD.  [facility] 12/05/2016, 11:15 AM

## 2016-12-05 NOTE — Progress Notes (Signed)
IV removed, AMA formed signed and explained to patient. Patient dressed himself and walked off of unit at 1130

## 2016-12-05 NOTE — Progress Notes (Signed)
Patient expresses desire to leave AMA. Dr. Janee Mornhompson made aware and has spoken with patient.

## 2016-12-05 NOTE — Progress Notes (Signed)
PROGRESS NOTE    Blake Gonzalez  ZOX:[id number] DOB: 04-26-1976 DOA: 12/03/2016 PCP: Default, Provider, MD    Brief Narrative:  Blake Gonzalez is a 41 y.o. 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  Patient presents to the ED with c/o ~2 week history of dry, productive cough, and RUQ abdominal pain.  Pain occurs with cough, reproducible on palpation and worse with deep breathing.  2 antitussive meds without relief of cough.  ED Course: Despite taking all of his meds today, including an EXTRA dose of clonadine when he developed a headache this afternoon, his BPs in the ED are running 250s/150s on repeated measurements!  He has no chest pain.  He states they usually have to end up admitting him and putting him on cardene gtt in the past when BP gets this high.  UDS positive for cocaine.    Assessment & Plan:   Principal Problem:   Hypertensive urgency, malignant Active Problems:   Thrombocytopenia (HCC)   CKD stage 4 secondary to hypertension (HCC)   Hypertrophic cardiomyopathy (HCC)  #1 malignant hypertensive urgency Likely triggered by cocaine use as UDS was positive for cocaine and THC however patient strongly denies any recent use of cocaine. Patient endorses compliance with his medications. Currently on Cardene drip. Continue home regimen of Norvasc at 10 mg daily, clonidine. Increased labetalol to 400 mg 3 times daily yesterday 12/04/2016. Cardizem drip was turned off however blood pressure bounces right back up with systolics in the 200s and diastolics in the 120s. Patient has been placed back on the Cardizem drip. Will add Lasix 80 mg twice daily and monitor renal function closely. If no improvement with blood pressure on diuretics will start patient on minoxidil. Case discussed with nephrology.Try to wean off Cardene drip  today.  #2 polysubstance abuse UDS was positive for cocaine and THC. Patient was counseled on cessation of polysubstance use. Patient strongly denies any recent use of cocaine.  #3 chronic kidney disease stage IV It was noted by admitting physician the patient's PCP wants patient to be evaluated by nephrology given his progressive renal failure and now with frank proteinuria as well as possible likely management of his hypertension. Case was discussed with nephrology, Dr. Kathrene Bongo who has reviewed the patient's chart and feels patient should be okay to be seen in the outpatient setting. Patient will be called with an appointment time to follow-up with nephrology in the outpatient setting.  #4 chronic thrombocytopenia Patient with no bleeding. SCDs for DVT prophylaxis. Continue home regimen of aspirin 325 mg daily due to history of coronary artery disease.    DVT prophylaxis: SCDs Code Status: full Family Communication: updated patient. No family present. Disposition Plan: remain in SDU while on Cardene drip.   Consultants:   none  Procedures:   Chest x-ray 12/04/2016  Antimicrobials:   none   Subjective: Patient states some improvement with upper abdominal pain No chest pain. No shortness of breath. Patient states a little bit of improvement with his cough. Patient noted to be very sensitive to the Cardene drip which when discontinued systolic blood pressures were in the 200s.complaining of upper abdominal pain worse with coughing which on admission was noted to be reproducible. Patient with improvement with headache. No chest pain. No shortness of breath. Patient complaining of cough. Patient denying recent cocaine use.  Objective: Vitals:   12/05/16 0700 12/05/16 0800 12/05/16 0855 12/05/16 0900  BP: (!) 170/100 (!) 170/108 (!) 190/109 (!) 178/103  Pulse: (!) 59 60 60 61  Resp: 11 (!) 23  15  Temp:  97.7 F (36.5 C)    TempSrc:  Oral    SpO2: 97% 98%  98%  Weight:       Height:        Intake/Output Summary (Last 24 hours) at 12/05/16 1001 Last data filed at 12/05/16 0548  Gross per 24 hour  Intake           394.92 ml  Output              450 ml  Net           -55.08 ml   Filed Weights   12/03/16 2223 12/04/16 0247  Weight: 70.3 kg (155 lb) 66.7 kg (147 lb 0.8 oz)    Examination:  General exam: Appears calm and comfortable  Respiratory system: Clear to auscultation. Respiratory effort normal. Cardiovascular system: S1 & S2 heard, RRR. No JVD, murmurs, rubs, gallops or clicks. No pedal edema. Gastrointestinal system: Abdomen is nondistended, soft and nontender. No organomegaly or masses felt. Normal bowel sounds heard. Central nervous system: Alert and oriented. No focal neurological deficits. Extremities: Symmetric 5 x 5 power. Skin: No rashes, lesions or ulcers Psychiatry: Judgement and insight appear normal. Mood & affect appropriate.     Data Reviewed: I have personally reviewed following labs and imaging studies  CBC:  Recent Labs Lab 12/03/16 2331 12/04/16 0310 12/05/16 0335  WBC 5.3 5.4 5.0  HGB 13.6 12.6* 12.1*  HCT 41.0 38.3* 37.2*  MCV 88.2 88.7 89.2  PLT 65* 63* 58*   Basic Metabolic Panel:  Recent Labs Lab 12/03/16 2331 12/04/16 0310 12/05/16 0335  NA 140 137 138  K 4.3 4.0 4.7  CL 111 110 111  CO2 23 21* 19*  GLUCOSE 90 125* 89  BUN 38* 38* 35*  CREATININE 3.38* 3.40* 3.58*  CALCIUM 9.1 8.6* 8.5*   GFR: Estimated Creatinine Clearance: 25.4 mL/min (A) (by C-G formula based on SCr of 3.58 mg/dL (H)). Liver Function Tests:  Recent Labs Lab 12/03/16 2331  AST 33  ALT 19  ALKPHOS 194*  BILITOT 0.5  PROT 7.8  ALBUMIN 3.9    Recent Labs Lab 12/03/16 2331  LIPASE 31   No results for input(s): AMMONIA in the last 168 hours. Coagulation Profile: No results for input(s): INR, PROTIME in the last 168 hours. Cardiac Enzymes:  Recent Labs Lab 12/04/16 1816 12/04/16 2348 12/05/16 0604    TROPONINI 0.05* 0.05* 0.05*   BNP (last 3 results) No results for input(s): PROBNP in the last 8760 hours. HbA1C: No results for input(s): HGBA1C in the last 72 hours. CBG: No results for input(s): GLUCAP in the last 168 hours. Lipid Profile: No results for input(s): CHOL, HDL, LDLCALC, TRIG, CHOLHDL, LDLDIRECT in the last 72 hours. Thyroid Function Tests: No results for input(s): TSH, T4TOTAL, FREET4, T3FREE, THYROIDAB in the last 72 hours. Anemia Panel: No results for input(s): VITAMINB12, FOLATE, FERRITIN, TIBC, IRON, RETICCTPCT in the last 72 hours. Sepsis Labs: No results for input(s): PROCALCITON, LATICACIDVEN in the last 168 hours.  Recent Results (from the past 240 hour(s))  MRSA PCR Screening     Status: None   Collection Time: 12/04/16  3:00 AM  Result Value Ref Range Status   MRSA by PCR NEGATIVE NEGATIVE Final    Comment:  The GeneXpert MRSA Assay (FDA approved for NASAL specimens only), is one component of a comprehensive MRSA colonization surveillance program. It is not intended to diagnose MRSA infection nor to guide or monitor treatment for MRSA infections.          Radiology Studies: Dg Chest 2 View  Result Date: 12/04/2016 CLINICAL DATA:  41 year old male with hypertension and cough EXAM: CHEST  2 VIEW COMPARISON:  Chest CT dated 10/11/2016 FINDINGS: Multiple small scattered calcified granulomas and seen on the prior CT. The lungs are otherwise clear. There is no pleural effusion or pneumothorax. The cardiac silhouette is within normal limits. An endovascular stent graft repair of the aortic arch and descending aorta noted. No acute osseous pathology identified. IMPRESSION: No active cardiopulmonary disease. Electronically Signed   By: Elgie CollardArash  Radparvar M.D.   On: 12/04/2016 01:26        Scheduled Meds: . amLODipine  10 mg Oral Daily  . aspirin EC  81 mg Oral Daily  . benzonatate  200 mg Oral TID  . cloNIDine  0.3 mg Oral BID  . labetalol   400 mg Oral TID  . lidocaine  1 patch Transdermal Q24H  . multivitamin with minerals  1 tablet Oral Daily  . pantoprazole  40 mg Oral Q0600  . sodium bicarbonate  650 mg Oral BID  . sodium chloride flush  3 mL Intravenous Q12H   Continuous Infusions: . niCARDipine Stopped (12/05/16 0548)     LOS: 1 day    Time spent: 35 minutes    Liyla Radliff, MD [facility] Pager [phone]  If 7PM-7AM, please contact night-coverage [email or web address] Password [email or web address] 12/05/2016, 10:01 AM

## 2016-12-15 ENCOUNTER — Emergency Department (HOSPITAL_COMMUNITY)
Admission: EM | Admit: 2016-12-15 | Discharge: 2016-12-16 | Disposition: A | Discharge status: Home / Self Care | Payer: 59 | Attending: Emergency Medicine | Admitting: Emergency Medicine

## 2016-12-15 ENCOUNTER — Emergency Department (HOSPITAL_COMMUNITY): Payer: 59

## 2016-12-15 ENCOUNTER — Encounter (HOSPITAL_COMMUNITY): Payer: Self-pay | Admitting: Emergency Medicine

## 2016-12-15 DIAGNOSIS — Z7982 Long term (current) use of aspirin: Secondary | ICD-10-CM | POA: Diagnosis not present

## 2016-12-15 DIAGNOSIS — R079 Chest pain, unspecified: Secondary | ICD-10-CM | POA: Diagnosis present

## 2016-12-15 DIAGNOSIS — I252 Old myocardial infarction: Secondary | ICD-10-CM | POA: Diagnosis not present

## 2016-12-15 DIAGNOSIS — I10 Essential (primary) hypertension: Secondary | ICD-10-CM

## 2016-12-15 DIAGNOSIS — F17210 Nicotine dependence, cigarettes, uncomplicated: Secondary | ICD-10-CM | POA: Diagnosis not present

## 2016-12-15 DIAGNOSIS — N184 Chronic kidney disease, stage 4 (severe): Secondary | ICD-10-CM | POA: Insufficient documentation

## 2016-12-15 DIAGNOSIS — R0781 Pleurodynia: Secondary | ICD-10-CM | POA: Insufficient documentation

## 2016-12-15 DIAGNOSIS — L03114 Cellulitis of left upper limb: Secondary | ICD-10-CM | POA: Diagnosis not present

## 2016-12-15 DIAGNOSIS — I129 Hypertensive chronic kidney disease with stage 1 through stage 4 chronic kidney disease, or unspecified chronic kidney disease: Secondary | ICD-10-CM | POA: Diagnosis not present

## 2016-12-15 LAB — COMPREHENSIVE METABOLIC PANEL
ALK PHOS: 171 U/L — AB (ref 38–126)
ALT: 20 U/L (ref 17–63)
AST: 40 U/L (ref 15–41)
Albumin: 3.6 g/dL (ref 3.5–5.0)
Anion gap: 9 (ref 5–15)
BUN: 50 mg/dL — ABNORMAL HIGH (ref 6–20)
CHLORIDE: 108 mmol/L (ref 101–111)
CO2: 19 mmol/L — AB (ref 22–32)
CREATININE: 3.7 mg/dL — AB (ref 0.61–1.24)
Calcium: 8.5 mg/dL — ABNORMAL LOW (ref 8.9–10.3)
GFR calc Af Amer: 22 mL/min — ABNORMAL LOW (ref >60)
GFR calc non Af Amer: 19 mL/min — ABNORMAL LOW (ref >60)
Glucose, Bld: 90 mg/dL (ref 65–99)
POTASSIUM: 4.5 mmol/L (ref 3.5–5.1)
Sodium: 136 mmol/L (ref 135–145)
Total Bilirubin: 0.6 mg/dL (ref 0.3–1.2)
Total Protein: 7.5 g/dL (ref 6.5–8.1)

## 2016-12-15 LAB — CBC WITH DIFFERENTIAL/PLATELET
Basophils Absolute: 0 10*3/uL (ref 0.0–0.1)
Basophils Relative: 0 %
EOS ABS: 0.2 10*3/uL (ref 0.0–0.7)
Eosinophils Relative: 3 %
HEMATOCRIT: 37.9 % — AB (ref 39.0–52.0)
HEMOGLOBIN: 12.4 g/dL — AB (ref 13.0–17.0)
LYMPHS ABS: 1.5 10*3/uL (ref 0.7–4.0)
LYMPHS PCT: 25 %
MCH: 28.3 pg (ref 26.0–34.0)
MCHC: 32.7 g/dL (ref 30.0–36.0)
MCV: 86.5 fL (ref 78.0–100.0)
MONOS PCT: 8 %
Monocytes Absolute: 0.4 10*3/uL (ref 0.1–1.0)
NEUTROS ABS: 3.7 10*3/uL (ref 1.7–7.7)
NEUTROS PCT: 64 %
Platelets: 72 10*3/uL — ABNORMAL LOW (ref 150–400)
RBC: 4.38 MIL/uL (ref 4.22–5.81)
RDW: 14.5 % (ref 11.5–15.5)
WBC: 5.8 10*3/uL (ref 4.0–10.5)

## 2016-12-15 LAB — I-STAT TROPONIN, ED: Troponin i, poc: 0.08 ng/mL (ref 0.00–0.08)

## 2016-12-15 MED ORDER — NITROGLYCERIN 0.4 MG SL SUBL
0.4000 mg | SUBLINGUAL_TABLET | Freq: Once | SUBLINGUAL | Status: DC
  Filled 2016-12-15: qty 1

## 2016-12-15 MED ORDER — CLONIDINE HCL 0.1 MG PO TABS
0.2000 mg | ORAL_TABLET | Freq: Once | ORAL | Status: AC
  Administered 2016-12-16: 0.2 mg via ORAL
  Filled 2016-12-15: qty 2

## 2016-12-15 MED ORDER — MORPHINE SULFATE (PF) 4 MG/ML IV SOLN
4.0000 mg | Freq: Once | INTRAVENOUS | Status: AC
  Administered 2016-12-15: 4 mg via INTRAVENOUS
  Filled 2016-12-15: qty 1

## 2016-12-15 MED ORDER — NITROGLYCERIN 0.4 MG SL SUBL: 0.4000 mg | SUBLINGUAL_TABLET | SUBLINGUAL | Status: DC | PRN

## 2016-12-15 NOTE — ED Notes (Signed)
Pt here for arm swelling. While waiting in the lobby to be seen, after his initial triage he stated he has started to have chest pain, back pain, and pain radiating up his arm into his chest.

## 2016-12-15 NOTE — ED Triage Notes (Signed)
Pt complaint of left hand/arm swelling worsening since Friday; thought it ws a bite; redness/swelling noted.

## 2016-12-15 NOTE — ED Provider Notes (Signed)
[facility] DEPT Provider Note   CSN: [id number] Arrival date & time: 12/15/16  1719  By signing my name below, I, Collene Leyden, attest that this documentation has been prepared under the direction and in the presence of [facility], PA-C. Electronically Signed: Collene Leyden, Scribe. 12/15/16. 10:34 PM.  History   Chief Complaint Chief Complaint  Patient presents with  . Arm Swelling   HPI Comments: Blake Gonzalez is a 41 y.o. male with a history of CKD, HTN, HOCM, type 2 thoracic dissection who presents to the Emergency Department complaining of sudden-onset, constant left wrist pain that began 4 days ago. Patient states he may have been bit by an insect, but did not feel a bite. Patient reports associated wrist/hand swelling and redness. Pain radiates up to chest and feels like a "sharp, stabbing" pain. Pain in the chest is constant and worse with movement/palpation. No alleviating factors noted. Denies a history of gout. Patient denies any fever, nausea, vomiting, injury or laceration. Patient endorses compliance with BP medication, stating he took all home BP meds this morning and took his labetalol tonight.    HPI  Past Medical History:  Diagnosis Date  . CKD (chronic kidney disease), stage III   . Cocaine abuse    Started using after divorce; no use around time of MI  . Crohn's colitis ([place])    Currently denies  . Hepatitis C    01/17/2012 labs  . HOCM (hypertrophic obstructive cardiomyopathy) ([place])   . Hypertension    Multiple Hospitalizations  . Kidney stones   . Myocardial infarction    In [place], [place]  . Type 2 dissection of thoracic aorta [facility])     Patient Active Problem List   Diagnosis Date Noted  . Malignant hypertension   . Hypertensive urgency, malignant 12/04/2016  . Hypertensive urgency 09/06/2016  . Elevated troponin 09/06/2016  . 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 CKD stage 4 secondary to hypertension ([place]) 09/06/2016  .  Hepatitis C 09/06/2016  . Hypertrophic cardiomyopathy ([place]) 09/06/2016  . Gastritis, acute 01/26/2012  . Thrombocytopenia ([place]) 01/24/2012  . Cocaine abuse   . Coronary artery disease due to lipid rich plaque   . Hypertension     Past Surgical History:  Procedure Laterality Date  . CARDIAC CATHETERIZATION  2011  . CORONARY ANGIOPLASTY WITH STENT PLACEMENT  2011  . LITHOTRIPSY    . OTHER SURGICAL HISTORY  2011   Percutaneous biliary tube  . RENAL BIOPSY  2000  . SMALL INTESTINE SURGERY    . THORACIC AORTIC ANEURYSM REPAIR  12/2015   At [facility] Medications    Prior to Admission medications   Medication Sig Start Date End Date Taking? Authorizing Provider  amLODipine (NORVASC) 10 MG tablet Take 10 mg by mouth daily.    Yes Historical Provider, MD  aspirin 325 MG tablet Take 325 mg by mouth every morning.    Yes Historical Provider, MD  cloNIDine (CATAPRES) 0.3 MG tablet Take 0.3 mg by mouth 2 (two) times daily.   Yes Historical Provider, MD  doxycycline (VIBRAMYCIN) 100 MG capsule Take 100 mg by mouth 2 (two) times daily.   Yes Historical Provider, MD  labetalol (NORMODYNE) 200 MG tablet Take 200 mg by mouth 3 (three) times daily.    Yes Historical Provider, MD  Multiple Vitamin (MULTIVITAMIN WITH MINERALS) TABS tablet Take 1 tablet by mouth every morning.    Yes Historical Provider, MD  clindamycin (CLEOCIN) 150 MG capsule Take 2 capsules (300 mg total) by mouth 4 (four) times daily. 12/16/16   Ozella Almond Nejla Reasor, PA-C  cloNIDine (CATAPRES) 0.2 MG tablet Take 1 tablet (0.2 mg total) by mouth 3 (three) times daily. Patient taking differently: Take 0.2 mg by mouth at bedtime.  01/27/12 10/10/16  Monika Salk, MD  nitroGLYCERIN 0.2 mg/mL infusion Inject 0-200 mcg/min into the vein continuous. 10/11/16   Patrecia Pour, MD  traMADol (ULTRAM) 50 MG tablet Take 1 tablet (50 mg total) by mouth every 12 (twelve) hours as needed. 12/16/16   Ozella Almond Avett Reineck, PA-C    Family  History Family History  Problem Relation Age of Onset  . Heart disease Father     early death at age 64  . Diabetes Maternal Aunt   . Cancer      Social History Social History  Substance Use Topics  . Smoking status: Current Every Day Smoker    Packs/day: 0.50    Types: Cigarettes  . Smokeless tobacco: Never Used  . Alcohol use No     Allergies   Hydralazine   Review of Systems Review of Systems  Constitutional: Negative for diaphoresis and fever.  Respiratory: Negative for shortness of breath.   Cardiovascular: Positive for chest pain (left-sided).  Gastrointestinal: Negative for nausea and vomiting.  Musculoskeletal: Positive for arthralgias (left shoulder, left wrist, elbow) and joint swelling (wrist).  Skin: Negative for wound.  All other systems reviewed and are negative.    Physical Exam Updated Vital Signs BP (!) 176/96 (BP Location: Right Arm)   Pulse 70   Temp 98.2 F (36.8 C) (Oral)   Resp 18   SpO2 99%   Physical Exam  Constitutional: He appears well-developed and well-nourished. No distress.  HENT:  Head: Normocephalic and atraumatic.  Neck: Neck supple.  Cardiovascular: Normal rate, regular rhythm and normal heart sounds.   No murmur heard. 2+ radial pulse.   Pulmonary/Chest: Effort normal and breath sounds normal. No respiratory distress. He has no wheezes. He has no rales. He exhibits tenderness.  Tenderness to left side of chest and left lateral rib cage.   Musculoskeletal: Normal range of motion. He exhibits tenderness.  Left wrist: decreased range of motion 2/2 pain. + swelling. Erythema over dorsum of the wrist - no streaking up the forearm. Diffuse tenderness to palpation of the wrist.   Neurological: He is alert.  Skin: Skin is warm and dry. Capillary refill takes less than 2 seconds.  Good cap refill.   Psychiatric: He has a normal mood and affect.  Nursing note and vitals reviewed.    ED Treatments / Results  DIAGNOSTIC  STUDIES: Oxygen Saturation is 99% on RA, normal by my interpretation.    COORDINATION OF CARE: 10:34 PM Discussed treatment plan with pt at bedside and pt agreed to plan, which includes imaging and pain medication.  Labs (all labs ordered are listed, but only abnormal results are displayed) Labs Reviewed  COMPREHENSIVE METABOLIC PANEL - Abnormal; Notable for the following:       Result Value   CO2 19 (*)    BUN 50 (*)    Creatinine, Ser 3.70 (*)    Calcium 8.5 (*)    Alkaline Phosphatase 171 (*)    GFR calc non Af Amer 19 (*)    GFR calc Af Amer 22 (*)    All other components within normal limits  CBC WITH DIFFERENTIAL/PLATELET - Abnormal; Notable for the following:  Hemoglobin 12.4 (*)    HCT 37.9 (*)    Platelets 72 (*)    All other components within normal limits  SEDIMENTATION RATE  I-STAT TROPOININ, ED  I-STAT TROPOININ, ED    EKG  EKG Interpretation  Date/Time:  Monday December 15 2016 19:36:55 EDT Ventricular Rate:  69 PR Interval:    QRS Duration: 108 QT Interval:  429 QTC Calculation: 460 R Axis:   16 Text Interpretation:  Sinus rhythm Left atrial enlargement LVH with secondary repolarization abnormality Anterior ST elevation, probably due to LVH No significant change since last tracing Confirmed by Zenia Resides  MD, ANTHONY ([id number]) on 12/15/2016 10:47:46 PM       Radiology Dg Chest 2 View  Result Date: 12/15/2016 CLINICAL DATA:  24 hour history of upper left chest pain radiating into upper extremity. EXAM: CHEST  2 VIEW COMPARISON:  12/04/2016 FINDINGS: Scattered calcified granulomata are again evident. The lungs are otherwise clear. The pulmonary vasculature is normal. Prior endoluminal aortic stent graft. Stable aortic contours. IMPRESSION: Calcified granulomatous changes.  No acute cardiopulmonary findings. Electronically Signed   By: Andreas Newport M.D.   On: 12/15/2016 23:14   Dg Wrist Complete Left  Result Date: 12/15/2016 CLINICAL DATA:  Upper extremity pain  and swelling for 4 days. No trauma. EXAM: LEFT WRIST - COMPLETE 3+ VIEW COMPARISON:  None. FINDINGS: There is no evidence of fracture or dislocation. There is no evidence of arthropathy or other focal bone abnormality. Soft tissues are unremarkable. IMPRESSION: Negative. Electronically Signed   By: Andreas Newport M.D.   On: 12/15/2016 23:15   Dg Shoulder Left  Result Date: 12/15/2016 CLINICAL DATA:  Left upper extremity pain and swelling for 4 days, without trauma. EXAM: LEFT SHOULDER - 2+ VIEW COMPARISON:  None. FINDINGS: There is no evidence of fracture or dislocation. There is no evidence of arthropathy or other focal bone abnormality. Soft tissues are unremarkable. IMPRESSION: Negative. Electronically Signed   By: Andreas Newport M.D.   On: 12/15/2016 23:15    Procedures Procedures (including critical care time)  Medications Ordered in ED Medications  morphine 4 MG/ML injection 4 mg (4 mg Intravenous Given 12/15/16 2307)  cloNIDine (CATAPRES) tablet 0.2 mg (0.2 mg Oral Given 12/16/16 0002)  clindamycin (CLEOCIN) IVPB 600 mg (0 mg Intravenous Stopped 12/16/16 0250)  labetalol (NORMODYNE,TRANDATE) injection 10 mg (10 mg Intravenous Given 12/16/16 0119)  morphine 4 MG/ML injection 4 mg (4 mg Intravenous Given 12/16/16 0250)     Initial Impression / Assessment and Plan / ED Course  I have reviewed the triage vital signs and the nursing notes.  Pertinent labs & imaging results that were available during my care of the patient were reviewed by me and considered in my medical decision making (see chart for details).    Blake Gonzalez is a 41 y.o. male who presents to ED for left hand pain which radiates up to chest. On exam, patient with swollen wrist with erythema which extends from the dorsum of the hand up to mid forearm. Normal white count and ESR of 3. Afebrile. No history of gout. Doubt septic joint. Likely cellulitis. Treated with clindamycin IV in ED and rx for oral clindamycin given. BP  elevated in ED today- BP trending downward with meds given. EKG unchanged from prior tracing. Troponin negative 2. Area of erythema demarcated by nursing staff. Discussed importance of monitoring the area and return precautions including erythema spreading despite him not to compliance, fevers, new or worsening symptoms. Patient voices understanding  and agreement with plan as dictated above and all questions were answered  Patient seen by and discussed with Dr. Zenia Resides who agrees with treatment plan.    Final Clinical Impressions(s) / ED Diagnoses   Final diagnoses:  Chest pain  Cellulitis of left upper extremity  Essential hypertension    New Prescriptions Discharge Medication List as of 12/16/2016  4:04 AM    START taking these medications   Details  clindamycin (CLEOCIN) 150 MG capsule Take 2 capsules (300 mg total) by mouth 4 (four) times daily., Starting Tue 12/16/2016, Print    traMADol (ULTRAM) 50 MG tablet Take 1 tablet (50 mg total) by mouth every 12 (twelve) hours as needed., Starting Tue 12/16/2016, Print       I personally performed the services described in this documentation, which was scribed in my presence. The recorded information has been reviewed and is accurate.     [facility] Caasi Giglia, PA-C 12/16/16 [place], MD 12/21/16 [phone]

## 2016-12-15 NOTE — ED Notes (Signed)
I attempted twice to collect labs and was unsuccessful 

## 2016-12-15 NOTE — ED Triage Notes (Signed)
Pt started to c/o chest pain while in waiting room  Pt states he has heart problems  Pt pulled back into triage and EKG obtained

## 2016-12-16 LAB — SEDIMENTATION RATE: SED RATE: 3 mm/h (ref 0–16)

## 2016-12-16 LAB — I-STAT TROPONIN, ED: Troponin i, poc: 0.07 ng/mL (ref 0.00–0.08)

## 2016-12-16 MED ORDER — MORPHINE SULFATE (PF) 4 MG/ML IV SOLN
4.0000 mg | Freq: Once | INTRAVENOUS | Status: AC
  Administered 2016-12-16: 4 mg via INTRAVENOUS
  Filled 2016-12-16: qty 1

## 2016-12-16 MED ORDER — CLINDAMYCIN HCL 150 MG PO CAPS: 300.0000 mg | ORAL_CAPSULE | Freq: Four times a day (QID) | ORAL | 0 refills | Status: AC

## 2016-12-16 MED ORDER — CLINDAMYCIN PHOSPHATE 600 MG/50ML IV SOLN
600.0000 mg | Freq: Once | INTRAVENOUS | Status: AC
  Administered 2016-12-16: 600 mg via INTRAVENOUS
  Filled 2016-12-16: qty 50

## 2016-12-16 MED ORDER — TRAMADOL HCL 50 MG PO TABS: 50.0000 mg | ORAL_TABLET | Freq: Two times a day (BID) | ORAL | 0 refills | Status: DC | PRN

## 2016-12-16 MED ORDER — LABETALOL HCL 5 MG/ML IV SOLN
10.0000 mg | Freq: Once | INTRAVENOUS | Status: AC
  Administered 2016-12-16: 10 mg via INTRAVENOUS
  Filled 2016-12-16: qty 4

## 2016-12-16 NOTE — ED Provider Notes (Signed)
Medical screening examination/treatment/procedure(s) were conducted as a shared visit with non-physician practitioner(s) and myself.  I personally evaluated the patient during the encounter.   EKG Interpretation  Date/Time:  Monday December 15 2016 19:36:55 EDT Ventricular Rate:  69 PR Interval:    QRS Duration: 108 QT Interval:  429 QTC Calculation: 460 R Axis:   16 Text Interpretation:  Sinus rhythm Left atrial enlargement LVH with secondary repolarization abnormality Anterior ST elevation, probably due to LVH No significant change since last tracing Confirmed by Atilano Covelli  MD, Moorea Boissonneault ([id number]) on 12/15/2016 10:47:44 PM     41 year old male with history of stage II chronic kidney disease presents with left arm swelling. No anginal quality to it. On exam he does have an area of erythema which starts in the dorsum of his wrist and extends a third of the forearm. He has no crepitus. Full range of motion.has a negative sedimentation rate. Suspect that this is cellulitis. Will treat with contrast 7 patient follow-up with his doctor.   Lorre Nick, MD 12/16/16 [phone]

## 2016-12-16 NOTE — ED Notes (Signed)
Troponin was 0.07. POC is not crossing over to system.

## 2016-12-16 NOTE — Discharge Instructions (Signed)
It was my pleasure taking care of you today!   Please take all of your antibiotics until finished!   Pain medication only as needed for severe pain - This can make you very drowsy - please do not drink alcohol, operate heavy machinery or drive on this medication.   Ice and elevation will aid in pain/swelling relief!   Please follow up with your primary doctor in 7 days for further discussion about your hospital visit today. Please return to the ER for fevers, redness spreading outside line drawn today despite antibiotics, new or worsening symptoms, any additional concerns.

## 2016-12-22 ENCOUNTER — Emergency Department (HOSPITAL_COMMUNITY): Payer: 59

## 2016-12-22 ENCOUNTER — Encounter (HOSPITAL_COMMUNITY): Payer: Self-pay | Admitting: Emergency Medicine

## 2016-12-22 ENCOUNTER — Emergency Department (HOSPITAL_COMMUNITY)
Admit: 2016-12-22 | Discharge: 2016-12-22 | Disposition: A | Discharge status: Home / Self Care | Payer: 59 | Attending: Emergency Medicine | Admitting: Emergency Medicine

## 2016-12-22 ENCOUNTER — Inpatient Hospital Stay (HOSPITAL_COMMUNITY)
Admission: EM | Admit: 2016-12-22 | Discharge: 2016-12-25 | DRG: 304 | Discharge status: Against Medical Advice | Payer: 59 | Attending: Internal Medicine | Admitting: Internal Medicine

## 2016-12-22 DIAGNOSIS — Z79899 Other long term (current) drug therapy: Secondary | ICD-10-CM

## 2016-12-22 DIAGNOSIS — D696 Thrombocytopenia, unspecified: Secondary | ICD-10-CM | POA: Diagnosis not present

## 2016-12-22 DIAGNOSIS — G5622 Lesion of ulnar nerve, left upper limb: Secondary | ICD-10-CM | POA: Diagnosis present

## 2016-12-22 DIAGNOSIS — I160 Hypertensive urgency: Principal | ICD-10-CM | POA: Diagnosis present

## 2016-12-22 DIAGNOSIS — I161 Hypertensive emergency: Secondary | ICD-10-CM | POA: Diagnosis not present

## 2016-12-22 DIAGNOSIS — F1410 Cocaine abuse, uncomplicated: Secondary | ICD-10-CM | POA: Diagnosis present

## 2016-12-22 DIAGNOSIS — I130 Hypertensive heart and chronic kidney disease with heart failure and stage 1 through stage 4 chronic kidney disease, or unspecified chronic kidney disease: Secondary | ICD-10-CM | POA: Diagnosis present

## 2016-12-22 DIAGNOSIS — M7989 Other specified soft tissue disorders: Secondary | ICD-10-CM

## 2016-12-22 DIAGNOSIS — Z955 Presence of coronary angioplasty implant and graft: Secondary | ICD-10-CM

## 2016-12-22 DIAGNOSIS — D638 Anemia in other chronic diseases classified elsewhere: Secondary | ICD-10-CM | POA: Diagnosis present

## 2016-12-22 DIAGNOSIS — F17210 Nicotine dependence, cigarettes, uncomplicated: Secondary | ICD-10-CM | POA: Diagnosis present

## 2016-12-22 DIAGNOSIS — K5010 Crohn's disease of large intestine without complications: Secondary | ICD-10-CM | POA: Diagnosis present

## 2016-12-22 DIAGNOSIS — M79602 Pain in left arm: Secondary | ICD-10-CM | POA: Diagnosis not present

## 2016-12-22 DIAGNOSIS — N179 Acute kidney failure, unspecified: Secondary | ICD-10-CM | POA: Diagnosis present

## 2016-12-22 DIAGNOSIS — I7101 Dissection of ascending aorta: Secondary | ICD-10-CM | POA: Diagnosis present

## 2016-12-22 DIAGNOSIS — R200 Anesthesia of skin: Secondary | ICD-10-CM | POA: Diagnosis present

## 2016-12-22 DIAGNOSIS — B1920 Unspecified viral hepatitis C without hepatic coma: Secondary | ICD-10-CM | POA: Diagnosis present

## 2016-12-22 DIAGNOSIS — I2510 Atherosclerotic heart disease of native coronary artery without angina pectoris: Secondary | ICD-10-CM | POA: Diagnosis present

## 2016-12-22 DIAGNOSIS — I129 Hypertensive chronic kidney disease with stage 1 through stage 4 chronic kidney disease, or unspecified chronic kidney disease: Secondary | ICD-10-CM | POA: Diagnosis present

## 2016-12-22 DIAGNOSIS — I10 Essential (primary) hypertension: Secondary | ICD-10-CM | POA: Diagnosis present

## 2016-12-22 DIAGNOSIS — Z7982 Long term (current) use of aspirin: Secondary | ICD-10-CM

## 2016-12-22 DIAGNOSIS — Z8249 Family history of ischemic heart disease and other diseases of the circulatory system: Secondary | ICD-10-CM

## 2016-12-22 DIAGNOSIS — Z888 Allergy status to other drugs, medicaments and biological substances status: Secondary | ICD-10-CM

## 2016-12-22 DIAGNOSIS — Z881 Allergy status to other antibiotic agents status: Secondary | ICD-10-CM

## 2016-12-22 DIAGNOSIS — I5042 Chronic combined systolic (congestive) and diastolic (congestive) heart failure: Secondary | ICD-10-CM | POA: Diagnosis present

## 2016-12-22 DIAGNOSIS — L03114 Cellulitis of left upper limb: Secondary | ICD-10-CM | POA: Diagnosis present

## 2016-12-22 DIAGNOSIS — N184 Chronic kidney disease, stage 4 (severe): Secondary | ICD-10-CM | POA: Diagnosis not present

## 2016-12-22 DIAGNOSIS — I421 Obstructive hypertrophic cardiomyopathy: Secondary | ICD-10-CM | POA: Diagnosis present

## 2016-12-22 DIAGNOSIS — I252 Old myocardial infarction: Secondary | ICD-10-CM

## 2016-12-22 LAB — SEDIMENTATION RATE: SED RATE: 1 mm/h (ref 0–16)

## 2016-12-22 LAB — COMPREHENSIVE METABOLIC PANEL
ALK PHOS: 167 U/L — AB (ref 38–126)
ALT: 20 U/L (ref 17–63)
ANION GAP: 6 (ref 5–15)
AST: 45 U/L — ABNORMAL HIGH (ref 15–41)
Albumin: 3.3 g/dL — ABNORMAL LOW (ref 3.5–5.0)
BUN: 51 mg/dL — ABNORMAL HIGH (ref 6–20)
CALCIUM: 8.4 mg/dL — AB (ref 8.9–10.3)
CHLORIDE: 110 mmol/L (ref 101–111)
CO2: 21 mmol/L — AB (ref 22–32)
CREATININE: 3.96 mg/dL — AB (ref 0.61–1.24)
GFR, EST AFRICAN AMERICAN: 20 mL/min — AB (ref >60)
GFR, EST NON AFRICAN AMERICAN: 17 mL/min — AB (ref >60)
Glucose, Bld: 104 mg/dL — ABNORMAL HIGH (ref 65–99)
Potassium: 4.7 mmol/L (ref 3.5–5.1)
Sodium: 137 mmol/L (ref 135–145)
Total Bilirubin: 0.7 mg/dL (ref 0.3–1.2)
Total Protein: 6.7 g/dL (ref 6.5–8.1)

## 2016-12-22 LAB — CBC WITH DIFFERENTIAL/PLATELET
BASOS PCT: 0 %
Basophils Absolute: 0 10*3/uL (ref 0.0–0.1)
EOS ABS: 0.2 10*3/uL (ref 0.0–0.7)
Eosinophils Relative: 5 %
HCT: 36.4 % — ABNORMAL LOW (ref 39.0–52.0)
Hemoglobin: 11.9 g/dL — ABNORMAL LOW (ref 13.0–17.0)
Lymphocytes Relative: 23 %
Lymphs Abs: 0.9 10*3/uL (ref 0.7–4.0)
MCH: 28.9 pg (ref 26.0–34.0)
MCHC: 32.7 g/dL (ref 30.0–36.0)
MCV: 88.3 fL (ref 78.0–100.0)
MONO ABS: 0.2 10*3/uL (ref 0.1–1.0)
Monocytes Relative: 6 %
NEUTROS ABS: 2.4 10*3/uL (ref 1.7–7.7)
Neutrophils Relative %: 66 %
PLATELETS: 61 10*3/uL — AB (ref 150–400)
RBC: 4.12 MIL/uL — ABNORMAL LOW (ref 4.22–5.81)
RDW: 14.8 % (ref 11.5–15.5)
WBC: 3.7 10*3/uL — ABNORMAL LOW (ref 4.0–10.5)

## 2016-12-22 LAB — C-REACTIVE PROTEIN: CRP: 1.5 mg/dL — AB (ref <1.0)

## 2016-12-22 LAB — I-STAT CG4 LACTIC ACID, ED: LACTIC ACID, VENOUS: 1.43 mmol/L (ref 0.5–1.9)

## 2016-12-22 MED ORDER — DIPHENHYDRAMINE HCL 50 MG/ML IJ SOLN
25.0000 mg | Freq: Once | INTRAMUSCULAR | Status: AC
Start: 2016-12-22 — End: 2016-12-22
  Administered 2016-12-22: 25 mg via INTRAVENOUS
  Filled 2016-12-22: qty 1

## 2016-12-22 MED ORDER — LABETALOL HCL 5 MG/ML IV SOLN
20.0000 mg | Freq: Once | INTRAVENOUS | Status: AC
  Administered 2016-12-22: 20 mg via INTRAVENOUS
  Filled 2016-12-22: qty 4

## 2016-12-22 MED ORDER — ONDANSETRON HCL 4 MG/2ML IJ SOLN
4.0000 mg | Freq: Once | INTRAMUSCULAR | Status: AC
  Administered 2016-12-22: 4 mg via INTRAVENOUS
  Filled 2016-12-22: qty 2

## 2016-12-22 MED ORDER — ONDANSETRON HCL 4 MG/2ML IJ SOLN
INTRAMUSCULAR | Status: AC
  Filled 2016-12-22: qty 2

## 2016-12-22 MED ORDER — VANCOMYCIN HCL IN DEXTROSE 1-5 GM/200ML-% IV SOLN
1000.0000 mg | Freq: Once | INTRAVENOUS | Status: AC
  Administered 2016-12-22: 1000 mg via INTRAVENOUS
  Filled 2016-12-22: qty 200

## 2016-12-22 MED ORDER — HYDROMORPHONE HCL 2 MG/ML IJ SOLN
1.0000 mg | Freq: Once | INTRAMUSCULAR | Status: AC
  Administered 2016-12-22: 1 mg via INTRAVENOUS
  Filled 2016-12-22: qty 1

## 2016-12-22 MED ORDER — MORPHINE SULFATE (PF) 2 MG/ML IV SOLN: 4.0000 mg | Freq: Once | INTRAVENOUS | Status: DC

## 2016-12-22 MED ORDER — HYDROMORPHONE HCL 2 MG/ML IJ SOLN
1.0000 mg | Freq: Once | INTRAMUSCULAR | Status: AC
Start: 2016-12-22 — End: 2016-12-22
  Administered 2016-12-22: 1 mg via INTRAVENOUS
  Filled 2016-12-22: qty 1

## 2016-12-22 MED ORDER — CLONIDINE HCL 0.1 MG PO TABS
0.2000 mg | ORAL_TABLET | Freq: Once | ORAL | Status: AC
  Administered 2016-12-22: 0.2 mg via ORAL
  Filled 2016-12-22: qty 2

## 2016-12-22 MED ORDER — HYDROMORPHONE HCL 2 MG/ML IJ SOLN: 1.0000 mg | Freq: Once | INTRAMUSCULAR | Status: DC

## 2016-12-22 MED ORDER — NICARDIPINE HCL IN NACL 20-0.86 MG/200ML-% IV SOLN
3.0000 mg/h | INTRAVENOUS | Status: DC
  Administered 2016-12-23: 5 mg/h via INTRAVENOUS
  Filled 2016-12-22: qty 200

## 2016-12-22 MED ORDER — HYDROMORPHONE HCL 2 MG/ML IJ SOLN
1.0000 mg | Freq: Once | INTRAMUSCULAR | Status: AC
  Administered 2016-12-22: 1 mg via INTRAMUSCULAR
  Filled 2016-12-22: qty 1

## 2016-12-22 NOTE — ED Notes (Signed)
Vascular at bedside

## 2016-12-22 NOTE — ED Notes (Signed)
Isaacs MD notified of pt BP.

## 2016-12-22 NOTE — ED Notes (Signed)
Patient transported to CT 

## 2016-12-22 NOTE — ED Notes (Signed)
Attempted IV x 2 w/o success. Will find another RN to attempt.

## 2016-12-22 NOTE — ED Notes (Signed)
Ultrasound in room- will collect vitals as soon as they are finished.

## 2016-12-22 NOTE — ED Triage Notes (Signed)
Pt was seen here on 4/3 for cellulitis of left arm, reports worsening pain and swelling and now numbness to extremity. Erythema noted to hand and part of lateral forearm. Pt states today was last dose of antibiotic and completed full course.

## 2016-12-22 NOTE — ED Notes (Signed)
Erma Heritage, MD made aware of pts hypertension.

## 2016-12-22 NOTE — ED Notes (Signed)
Patient transported to X-ray 

## 2016-12-22 NOTE — H&P (Addendum)
History and Physical    Blake Gonzalez ZHY:[id number] DOB: [birth year] DOA: 12/22/2016  Referring MD/NP/PA:   PCP: Pcp Not In System   Patient coming from:  The patient is coming from home.  At baseline, pt is independent for most of ADL.      Chief Complaint: Left arm swelling, pain and numbness  HPI: Blake Spampinato is a 41 y.o. male with medical history significant of s/p of repair of a type B dissection at [place] about 1 year ago, CAD, stent placement,  HOCM, HCV, Crohn's disease, cocaine abuse, tobacco abuse, sCHF, CKD-IV, who presents with left arm swelling, pain and numbness.  Patient states that he has been having left arm swelling, pain and erythema for almost 2 weeks. He was seen in ED on 12/15/16, and diagnosed with cellulitis. Patient was started with clindamycin. He states that he has been taking this medication. His left arm and redness has improved, but the swelling has been getting worse. Currently he has constant pain, dull, 8 out of 10 in severity, nonradiating. He also developed some numbness of left volar forearm and ulnar aspect of hand. He denies current fever or chills but states he did have fever of 101 about 10 days ago. He denies any trauma to the left hand or arm. He also has some mild weakness in left arm. Patient denies chest pain, SOB, nausea, vomiting, diarrhea, abdominal pain, symptoms of UTI. Patient does not have facial droop, slurred speech, vision change, hearing loss.   He denies any IV drug use.  He denies any recent illicit drug use with the exception of marijuana. However, he was tested positive for cocaine 2 weeks ago on urine drug screen. Of note, pt was initially started with IV vancomycin in ED, but he developed red man syndrome, then vancomycin was discontinued.  ED Course: pt was found to have WBC 3.7, lactate 1.43, slightly worsening renal function, CRP 1.43, ESR 1.0, temperature normal, blood pressure elevation at 215/121.  Pt is admitted to SDU as inpt. IV  cardenene gtt was started. VVS, Dr. Oneida Alar was consulted.   # CT of chest showed no expansion of type B aortic dissection, no perigraft fluid or gas, ? Enlargement of venous collaterals left shoulder region  # UE venous doppler showed is negative for acute thrombosis, but had a low level echoes seen near a valve at the proximal left IJV with unknown etiology.  # x-ray of left arm and left wrist are negative for bony abnormalities.  Review of Systems:   General: no fevers, chills, no changes in body weight,  has fatigue HEENT: no blurry vision, hearing changes or sore throat Respiratory: no dyspnea, coughing, wheezing CV: no chest pain, no palpitations GI: no nausea, vomiting, abdominal pain, diarrhea, constipation GU: no dysuria, burning on urination, increased urinary frequency, hematuria  Ext: no leg edema. Has left arm swelling, erythema, tenderness and  numbness.  Neuro: has left arm numbness.  no vision change or hearing loss Skin: no rash, no skin tear. MSK: No muscle spasm, no deformity, no limitation of range of movement in spin Heme: No easy bruising.  Travel history: No recent long distant travel.  Allergy:  Allergies  Allergen Reactions  . Vancomycin     Itching Rash MD verbalizes "Red Man Syndrome"  . Hydralazine Hypertension    Past Medical History:  Diagnosis Date  . CKD (chronic kidney disease), stage III   . Cocaine abuse    Started using after divorce; no use around  time of MI  . Crohn's colitis ([place])    Currently denies  . Hepatitis C    01/17/2012 labs  . HOCM (hypertrophic obstructive cardiomyopathy) ([place])   . Hypertension    Multiple Hospitalizations  . Kidney stones   . Myocardial infarction    In [place], [place]  . Type 2 dissection of thoracic aorta [facility])     Past Surgical History:  Procedure Laterality Date  . CARDIAC CATHETERIZATION  2011  . CORONARY ANGIOPLASTY WITH STENT PLACEMENT  2011  . LITHOTRIPSY    . OTHER SURGICAL HISTORY   2011   Percutaneous biliary tube  . RENAL BIOPSY  2000  . SMALL INTESTINE SURGERY    . THORACIC AORTIC ANEURYSM REPAIR  12/2015   At [place]    Social History:  reports that he has been smoking Cigarettes.  He has been smoking about 0.50 packs per day. He has never used smokeless tobacco. He reports that he does not drink alcohol or use drugs.  Family History:  Family History  Problem Relation Age of Onset  . Heart disease Father     early death at age 62  . Diabetes Maternal Aunt   . Cancer       Prior to Admission medications   Medication Sig Start Date End Date Taking? Authorizing Provider  acetaminophen (TYLENOL) 500 MG tablet Take 500 mg by mouth every 6 (six) hours as needed (pain).   Yes Historical Provider, MD  amLODipine (NORVASC) 10 MG tablet Take 10 mg by mouth daily.    Yes Historical Provider, MD  aspirin 325 MG tablet Take 325 mg by mouth every morning.    Yes Historical Provider, MD  cloNIDine (CATAPRES) 0.2 MG tablet Take 1 tablet (0.2 mg total) by mouth 3 (three) times daily. Patient taking differently: Take 0.2 mg by mouth at bedtime.  01/27/12 12/22/16 Yes Monika Salk, MD  cloNIDine (CATAPRES) 0.3 MG tablet Take 0.3 mg by mouth 2 (two) times daily.   Yes Historical Provider, MD  labetalol (NORMODYNE) 200 MG tablet Take 200 mg by mouth 3 (three) times daily.    Yes Historical Provider, MD  Multiple Vitamin (MULTIVITAMIN WITH MINERALS) TABS tablet Take 1 tablet by mouth every morning.    Yes Historical Provider, MD  traMADol (ULTRAM) 50 MG tablet Take 1 tablet (50 mg total) by mouth every 12 (twelve) hours as needed. 12/16/16  Yes Jaime Pilcher Ward, PA-C  clindamycin (CLEOCIN) 150 MG capsule Take 2 capsules (300 mg total) by mouth 4 (four) times daily. Patient not taking: Reported on 12/22/2016 12/16/16   [facility] Ward, PA-C  doxycycline (VIBRAMYCIN) 100 MG capsule Take 100 mg by mouth 2 (two) times daily.    Historical Provider, MD  nitroGLYCERIN 0.2 mg/mL infusion  Inject 0-200 mcg/min into the vein continuous. 10/11/16   Patrecia Pour, MD    Physical Exam: Vitals:   12/23/16 0200 12/23/16 0215 12/23/16 0230 12/23/16 0236  BP: 121/87 124/77 121/81   Pulse: 70 69 68 71  Resp: '15 12 12   ' Temp:      TempSrc:      SpO2: 95% 93% 94%   Weight:      Height:       General: Not in acute distress HEENT:       Eyes: PERRL, EOMI, no scleral icterus.       ENT: No discharge from the ears and nose, no pharynx injection, no tonsillar enlargement.  Neck: No JVD, no bruit, no mass felt. Heme: No neck lymph node enlargement. Cardiac: S1/S2, RRR, No murmurs, No gallops or rubs. Respiratory: No rales, wheezing, rhonchi or rubs. GI: Soft, nondistended, nontender, no rebound pain, no organomegaly, BS present. GU: No hematuria Ext: No pitting leg edema bilaterally. 2+DP/PT pulse bilaterally. Has left arm swelling, erythema, tenderness and  Numbness in ulnar side Musculoskeletal: No joint deformities, No joint redness or warmth, no limitation of ROM in spin. Skin: No rashes.  Neuro: Alert, oriented X3, cranial nerves II-XII grossly intact, moves all extremities normally. Muscle strength 5/5 in all extremities, sensation to light touch is intact. Brachial reflex 2+ bilaterally. Negative Babinski's sign. Normal finger to nose test. Psych: Patient is not psychotic, no suicidal or hemocidal ideation.  Labs on Admission: I have personally reviewed following labs and imaging studies  CBC:  Recent Labs Lab 12/22/16 1802  WBC 3.7*  NEUTROABS 2.4  HGB 11.9*  HCT 36.4*  MCV 88.3  PLT 61*   Basic Metabolic Panel:  Recent Labs Lab 12/22/16 1802  NA 137  K 4.7  CL 110  CO2 21*  GLUCOSE 104*  BUN 51*  CREATININE 3.96*  CALCIUM 8.4*   GFR: Estimated Creatinine Clearance: 22.9 mL/min (A) (by C-G formula based on SCr of 3.96 mg/dL (H)). Liver Function Tests:  Recent Labs Lab 12/22/16 1802  AST 45*  ALT 20  ALKPHOS 167*  BILITOT 0.7  PROT 6.7    ALBUMIN 3.3*   No results for input(s): LIPASE, AMYLASE in the last 168 hours. No results for input(s): AMMONIA in the last 168 hours. Coagulation Profile: No results for input(s): INR, PROTIME in the last 168 hours. Cardiac Enzymes: No results for input(s): CKTOTAL, CKMB, CKMBINDEX, TROPONINI in the last 168 hours. BNP (last 3 results) No results for input(s): PROBNP in the last 8760 hours. HbA1C: No results for input(s): HGBA1C in the last 72 hours. CBG: No results for input(s): GLUCAP in the last 168 hours. Lipid Profile: No results for input(s): CHOL, HDL, LDLCALC, TRIG, CHOLHDL, LDLDIRECT in the last 72 hours. Thyroid Function Tests: No results for input(s): TSH, T4TOTAL, FREET4, T3FREE, THYROIDAB in the last 72 hours. Anemia Panel: No results for input(s): VITAMINB12, FOLATE, FERRITIN, TIBC, IRON, RETICCTPCT in the last 72 hours. Urine analysis:    Component Value Date/Time   COLORURINE YELLOW 12/03/2016 2253   APPEARANCEUR CLEAR 12/03/2016 2253   LABSPEC 1.016 12/03/2016 2253   PHURINE 5.0 12/03/2016 2253   GLUCOSEU NEGATIVE 12/03/2016 2253   HGBUR MODERATE (A) 12/03/2016 2253   BILIRUBINUR NEGATIVE 12/03/2016 2253   KETONESUR NEGATIVE 12/03/2016 2253   PROTEINUR >=300 (A) 12/03/2016 2253   UROBILINOGEN 0.2 01/30/2012 0204   NITRITE NEGATIVE 12/03/2016 2253   LEUKOCYTESUR NEGATIVE 12/03/2016 2253   Sepsis Labs: '@LABRCNTIP' (procalcitonin:4,lacticidven:4) )No results found for this or any previous visit (from the past 240 hour(s)).   Radiological Exams on Admission: Dg Forearm Left  Result Date: 12/22/2016 CLINICAL DATA:  Worsening LEFT arm cellulitis, completed antibiotic course. EXAM: LEFT WRIST - COMPLETE 3+ VIEW; LEFT FOREARM - 2 VIEW COMPARISON:  LEFT wrist radiographs December 15, 2016 FINDINGS: No acute fracture deformity or dislocation. Joint spaces intact without erosions. No destructive bony lesions. Dorsal wrist and hand soft tissue swelling without subcutaneous  gas or radiopaque foreign bodies. IMPRESSION: Soft tissue swelling without acute osseous process. Electronically Signed   By: Elon Alas M.D.   On: 12/22/2016 17:23   Dg Wrist Complete Left  Result Date: 12/22/2016 CLINICAL DATA:  Worsening LEFT arm cellulitis, completed antibiotic course. EXAM: LEFT WRIST - COMPLETE 3+ VIEW; LEFT FOREARM - 2 VIEW COMPARISON:  LEFT wrist radiographs December 15, 2016 FINDINGS: No acute fracture deformity or dislocation. Joint spaces intact without erosions. No destructive bony lesions. Dorsal wrist and hand soft tissue swelling without subcutaneous gas or radiopaque foreign bodies. IMPRESSION: Soft tissue swelling without acute osseous process. Electronically Signed   By: Elon Alas M.D.   On: 12/22/2016 17:23   Ct Chest Wo Contrast  Result Date: 12/22/2016 CLINICAL DATA:  Left arm pain with numbness. History of aortic dissection and aneurysm repair. EXAM: CT CHEST WITHOUT CONTRAST TECHNIQUE: Multidetector CT imaging of the chest was performed following the standard protocol without IV contrast. COMPARISON:  10/11/2016 CT, 12/15/2016 CXR FINDINGS: Cardiovascular: Stable cardiomegaly without significant pericardial effusion or thickening. Coronary arteriosclerosis is again visualized with coronary artery stent. Chronic stable 4.1 cm ascending aortic aneurysm unchanged in appearance. An aortic endovascular graft is seen starting in the proximal aortic arch at intake of the left subclavian artery and along the descending thoracic aorta into the level the diaphragmatic hiatus where there is chronic stable tortuous appearance artery aorta noted. Chronic stable mild dilatation of the main pulmonary artery to 3.6 cm. Further evaluation of aorta and pulmonary vessels is limited on this noncontrast study. No evidence aneurysm leak or intramural hematoma. Partially calcified mural thrombus is seen along the distal aortic arch as before. Suprarenal aneurysmal dilatation of the  aorta is seen at the diaphragmatic hiatus measuring up to 5.7 cm versus 5.4 cm previously, some of this is due to slight operator dependent imaging differences. Mediastinum/Nodes: Multiple small calcified mediastinal lymph nodes are noted consistent with old granulomatous disease. The esophagus and thyroid are grossly unremarkable. No hilar nor mediastinal adenopathy. Lungs/Pleura: Bilateral scattered pulmonary nodules are in keeping with old granulomatous disease and stable in appearance. No focal pulmonary consolidation, effusion or pneumothorax. Upper Abdomen: Splenic granulomas are also present with stable splenomegaly. Mild atrophy of the kidneys with perinephric fat stranding. Nonobstructing calculi noted of the left kidney with faint nephrocalcinosis suggested. Musculoskeletal: No chest wall mass or suspicious bone lesions identified. No axillary mass or adenopathy to account for the left shoulder pain and numbness. IMPRESSION: 1. No acute cardiopulmonary disease. Calcified pulmonary nodules, mediastinal calcified lymph nodes and splenic calcifications are consistent old granulomatous disease. 2. Cardiomegaly with coronary arteriosclerosis and coronary arterial stenting. No significant change in the appearance of the diffuse dilatation of the descending thoracic and upper abdominal aorta compatible with known type B dissection with endovascular stent graft along the descending thoracic aorta. Electronically Signed   By: Ashley Royalty M.D.   On: 12/22/2016 21:42   Ct Cervical Spine Wo Contrast  Result Date: 12/22/2016 CLINICAL DATA:  Acute onset of left arm pain and numbness, with erythema. Initial encounter. EXAM: CT CERVICAL SPINE WITHOUT CONTRAST TECHNIQUE: Multidetector CT imaging of the cervical spine was performed without intravenous contrast. Multiplanar CT image reconstructions were also generated. COMPARISON:  None. FINDINGS: Alignment: Normal. Skull base and vertebrae: No acute fracture. No primary  bone lesion or focal pathologic process. Soft tissues and spinal canal: No prevertebral fluid or swelling. No visible canal hematoma. Disc levels: Multilevel disc space narrowing is noted at the mid to lower cervical spine, with minimal vacuum phenomenon noted at C6-C7. Scattered anterior posterior disc osteophyte complexes are noted at the lower cervical spine. Upper chest: The patient's large aortic arch and descending thoracic aorta aneurysm is better characterized on concurrent CT  of the chest. A calcified granuloma is noted at the left lung apex. The thyroid gland is unremarkable in appearance. Other: The minimally visualized portions of the brain are unremarkable. IMPRESSION: 1. No evidence of fracture or subluxation along the cervical spine. 2. Mild degenerative change at the mid to lower cervical spine. 3. Large aortic arch and descending thoracic aortic aneurysm is better characterized on concurrent CT of the chest. Electronically Signed   By: Garald Balding M.D.   On: 12/22/2016 21:29     EKG: Independently reviewed. Sinus rhythm, QTC 499, T-wave inversion in lateral leads and only 3-V6, which is similar to previous EKG on 12/15/16.   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 No chest pain or shortness rest. Patient was treated with labetalol 20 mg 1, clonidine 0.2 mg in ED, with only mild improvement.   -Will admit to Tele bed as inpt -start cardene drip for now, the of goal of bp reduction is by about 25 to 30% in first several hours, at SBP 160 to 180 mmHg. - Frequent neuro check  - continue home meds: Amlodipine, clonidine, labetalol - trop x 3  Left arm swelling, tenderness, erythema and numbness: UE  venous doppler showed is negative for acute thrombosis, but had a low level echoes seen near a valve at the proximal left IJV with unknown etiology. VVS, dr. Oneida Alar was consulted. Per Dr. Oneida Alar, " Left forearm swelling with erythema suggestive of cellulitis.  I do not know the etiology of the numbness in his left forearm related to this process.  He does not appear to have a compartment syndrome.  His stent graft is in good position without obvious evidence of infection.  DVT US showed no left arm DVT.  No evidence of dissection progression". Dr. Eden Lathe recommended to get MRA.  -Highly appreciated Dr. Oneida Alar consultation and recommendations. -will get MRA -will also get MRI of brain and MRI-C spin to rule out intracranial or C Spinal cord abnormalities due to left arm numbness. -Clindamycin iv -Follow up blood culture -prn percocet for pain (pt received IV norcotics in ED, will try to avoid due to cocaine abuse)  Thrombocytopenia: This is a chronic issue. platelet is 61, which was 72/2/18. No bleeding tendency. Etiology is not clear. -Follow-up by CBC  Tobacco abuse and cocaine abuse: -Did counseling about importance of quitting substance use. -Nicotine patch -check UDS  Type 2 dissection of thoracic aorta ([place]): s/p of repair 12/2015. Seems stable on CT-chest  -no acute issue  CKD stage 4 secondary to hypertension [facility]): Slightly worsening renal function. Baseline creatinine 3.3-3.5. His creatinine is 3.96 on admission. -Monitor renal function by BMP   DVT ppx: SQ Lovenox Code Status: Full code Family Communication: None at bed side.   Disposition Plan:  Anticipate discharge back to previous home environment Consults called:  VVS, Dr.Fields Admission status: SDU/inpation       Date of Service 12/23/2016    Ivor Costa [facility] Pager [phone]  If 7PM-7AM, please contact night-coverage [email or web address] Password [email or web address] 12/23/2016, 2:43 AM

## 2016-12-22 NOTE — Progress Notes (Signed)
*  PRELIMINARY RESULTS* Vascular Ultrasound Left upper extremity venous duplex has been completed.  Preliminary findings: The left upper extremity is negative for acute thrombosis in the visualized veins.  Low level echoes seen near a valve at the proximal left IJV - unknown etiology.  Preliminary results given to Dr. Erma Heritage at bedside 19:15.  Chauncey Fischer 12/22/2016, 7:21 PM

## 2016-12-22 NOTE — ED Provider Notes (Signed)
[facility]-EMERGENCY DEPT Provider Note   CSN: [id number] Arrival date & time: 12/22/16  1501     History   Chief Complaint Chief Complaint  Patient presents with  . Arm Swelling  . Cellulitis    HPI Blake Gonzalez is a 41 y.o. male.  HPI   41 year old male with complex past medical history including history of myocardial infarction, hypertension, hepatitis, history of aortic dissection status post repair in [facility] here with left arm pain. Pt states that 2 weeks ago, he had relatively acute onset of a sharp, throbbing left shoulder and hand/arm pain with swelling. He was seen in the ED at that time and had negative troponins, was placed on ABX and sent home. Since then, he has had progressively worsening arm pain, swelling, and has now developed numbness and weakness in his left hand. Denies any trauma. No headache or neck pain. No chest pain. Of note, he has a h/o dissection s/p repair. Non compliant with his meds. No chest pain.  Past Medical History:  Diagnosis Date  . CKD (chronic kidney disease), stage III   . Cocaine abuse    Started using after divorce; no use around time of MI  . Crohn's colitis (HCC)    Currently denies  . Hepatitis C    01/17/2012 labs  . HOCM (hypertrophic obstructive cardiomyopathy) (HCC)   . Hypertension    Multiple Hospitalizations  . Kidney stones   . Myocardial infarction    In [place], [place]  . Type 2 dissection of thoracic aorta [facility])     Patient Active Problem List   Diagnosis Date Noted  . Malignant hypertension   . Hypertensive urgency, malignant 12/04/2016  . Hypertensive urgency 09/06/2016  . Elevated troponin 09/06/2016  . 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 Hepatitis C 09/06/2016  . Hypertrophic cardiomyopathy (HCC) 09/06/2016  . Gastritis, acute 01/26/2012  . Thrombocytopenia (HCC) 01/24/2012  . Cocaine abuse   . Coronary artery disease due to lipid  rich plaque   . Hypertension     Past Surgical History:  Procedure Laterality Date  . CARDIAC CATHETERIZATION  2011  . CORONARY ANGIOPLASTY WITH STENT PLACEMENT  2011  . LITHOTRIPSY    . OTHER SURGICAL HISTORY  2011   Percutaneous biliary tube  . RENAL BIOPSY  2000  . SMALL INTESTINE SURGERY    . THORACIC AORTIC ANEURYSM REPAIR  12/2015   At [facility] Medications    Prior to Admission medications   Medication Sig Start Date End Date Taking? Authorizing Provider  acetaminophen (TYLENOL) 500 MG tablet Take 500 mg by mouth every 6 (six) hours as needed (pain).   Yes Historical Provider, MD  amLODipine (NORVASC) 10 MG tablet Take 10 mg by mouth daily.    Yes Historical Provider, MD  aspirin 325 MG tablet Take 325 mg by mouth every morning.    Yes Historical Provider, MD  cloNIDine (CATAPRES) 0.2 MG tablet Take 1 tablet (0.2 mg total) by mouth 3 (three) times daily. Patient taking differently: Take 0.2 mg by mouth at bedtime.  01/27/12 12/22/16 Yes Laveda Norman, MD  cloNIDine (CATAPRES) 0.3 MG tablet Take 0.3 mg by mouth 2 (two) times daily.   Yes Historical Provider, MD  labetalol (NORMODYNE) 200 MG tablet Take 200 mg by mouth 3 (three) times daily.    Yes Historical Provider, MD  Multiple Vitamin (MULTIVITAMIN WITH  MINERALS) TABS tablet Take 1 tablet by mouth every morning.    Yes Historical Provider, MD  traMADol (ULTRAM) 50 MG tablet Take 1 tablet (50 mg total) by mouth every 12 (twelve) hours as needed. 12/16/16  Yes Jaime Pilcher Ward, PA-C  clindamycin (CLEOCIN) 150 MG capsule Take 2 capsules (300 mg total) by mouth 4 (four) times daily. Patient not taking: Reported on 12/22/2016 12/16/16   [facility] Ward, PA-C  doxycycline (VIBRAMYCIN) 100 MG capsule Take 100 mg by mouth 2 (two) times daily.    Historical Provider, MD  nitroGLYCERIN 0.2 mg/mL infusion Inject 0-200 mcg/min into the vein continuous. 10/11/16   Tyrone Nine, MD    Family History Family History  Problem  Relation Age of Onset  . Heart disease Father     early death at age 46  . Diabetes Maternal Aunt   . Cancer      Social History Social History  Substance Use Topics  . Smoking status: Current Every Day Smoker    Packs/day: 0.50    Types: Cigarettes  . Smokeless tobacco: Never Used  . Alcohol use No     Allergies   Hydralazine   Review of Systems Review of Systems  Constitutional: Positive for fatigue. Negative for fever.  Musculoskeletal: Positive for arthralgias and joint swelling.  Neurological: Positive for weakness and numbness.  All other systems reviewed and are negative.    Physical Exam Updated Vital Signs BP (!) 150/129 (BP Location: Right Arm)   Pulse 67   Temp 97.6 F (36.4 C) (Oral)   Resp 19   Ht 6' (1.829 m)   Wt 147 lb (66.7 kg)   SpO2 100%   BMI 19.94 kg/m   Physical Exam  Constitutional: He is oriented to person, place, and time. He appears well-developed and well-nourished. He appears distressed (appears in pain).  HENT:  Head: Normocephalic and atraumatic.  Eyes: Conjunctivae are normal.  Neck: Neck supple.  Cardiovascular: Normal rate, regular rhythm and normal heart sounds.  Exam reveals no friction rub.   No murmur heard. Pulmonary/Chest: Effort normal and breath sounds normal. No respiratory distress. He has no wheezes. He has no rales.  Abdominal: He exhibits no distension.  Musculoskeletal: He exhibits no edema.  Neurological: He is alert and oriented to person, place, and time. He exhibits normal muscle tone.  Skin: Skin is warm. Capillary refill takes less than 2 seconds.  Psychiatric: He has a normal mood and affect.  Nursing note and vitals reviewed.   UPPER EXTREMITY EXAM: LEFT  INSPECTION & PALPATION: Moderate swelling distal to elbow, with mild erythema though not significantly worse than right hand due to skin coloration. There is significant TTP throughout the forearm and hand but compartments are soft. No induration or  fluctuance.  SENSORY: Sensation is intact to light touch in:  Superficial radial nerve distribution (dorsal first web space) Median nerve distribution (tip of index finger)    Sensation diminished in: Ulnar nerve distribution (tip of small finger)     MOTOR:  + Motor posterior interosseous nerve (thumb IP extension) + Anterior interosseous nerve (thumb IP flexion, index finger DIP flexion) + Radial nerve (wrist extension) + Median nerve (palpable firing thenar mass)  Marked weakness along distribution of: + Ulnar nerve (palpable firing of first dorsal interosseous muscle)  VASCULAR: 2+ radial pulse Brisk capillary refill < 2 sec, fingers warm and well-perfused  COMPARTMENTS: Soft, warm, well-perfused No pain with passive extension No paresthesias     ED  Treatments / Results  Labs (all labs ordered are listed, but only abnormal results are displayed) Labs Reviewed - No data to display  EKG  EKG Interpretation None       Radiology No results found.  Procedures Procedures (including critical care time)   CRITICAL CARE Performed by: Dollene Cleveland   Total critical care time: 55 minutes  Critical care time was exclusive of separately billable procedures and treating other patients.  Critical care was necessary to treat or prevent imminent or life-threatening deterioration.  Critical care was time spent personally by me on the following activities: development of treatment plan with patient and/or surrogate as well as nursing, discussions with consultants, evaluation of patient's response to treatment, examination of patient, obtaining history from patient or surrogate, ordering and performing treatments and interventions, ordering and review of laboratory studies, ordering and review of radiographic studies, pulse oximetry and re-evaluation of patient's condition.    Medications Ordered in ED Medications - No data to display   Initial Impression /  Assessment and Plan / ED Course  I have reviewed the triage vital signs and the nursing notes.  Pertinent labs & imaging results that were available during my care of the patient were reviewed by me and considered in my medical decision making (see chart for details).     41 yo M with complicated PMHx including HTN, aortic dissection s/p repair in [place], hepatitis, here with left arm pain and swelling in absence of any trauma. Rx'ed antibiotics last week with no improvement. Exam is as above, concerning for diffuse swelling, also + ulnar neuropathy. DDx broad at this time. I am less convinced that this is a cellulitis. Primary concern is possible vascular etiology, such as DVT or possibly dissection to left subclavian. Also must consider brachial plexopathy though no trauma, CT scan neg. Labs are overall reassuring, though with mild AoCKD - pt is baseline CKD V and has worsening. No other focal neuro deficits to suggest CVA.   Vascular U/S obtained - tech concerned for mobile, hyperechoic material in left IJ. Called Radiology - they cannot access images until tomorrow. Pt remains markedly hypertensive. Will start on cardene gtt for HTN emergency. Discussed in detail with Dr. Clyde Lundborg of Hospitalist. He has requested a vascular consultation given complex presentation c/f vascular etiology in pt that cannot undergo CT or MRI imaging 2/2 CKD. D/w Dr. Darrick Penna, who will evaluate pt in ED. Plan to admit to Hospitalist pending Vascular eval.  Final Clinical Impressions(s) / ED Diagnoses   Final diagnoses:  Left arm pain  Ulnar neuropathy of left upper extremity  Hypertensive emergency      Shaune Pollack, MD 12/23/16 [phone]

## 2016-12-23 ENCOUNTER — Inpatient Hospital Stay (HOSPITAL_COMMUNITY): Payer: 59

## 2016-12-23 DIAGNOSIS — I130 Hypertensive heart and chronic kidney disease with heart failure and stage 1 through stage 4 chronic kidney disease, or unspecified chronic kidney disease: Secondary | ICD-10-CM | POA: Diagnosis present

## 2016-12-23 DIAGNOSIS — R200 Anesthesia of skin: Secondary | ICD-10-CM | POA: Diagnosis not present

## 2016-12-23 DIAGNOSIS — Z79899 Other long term (current) drug therapy: Secondary | ICD-10-CM | POA: Diagnosis not present

## 2016-12-23 DIAGNOSIS — B1920 Unspecified viral hepatitis C without hepatic coma: Secondary | ICD-10-CM | POA: Diagnosis present

## 2016-12-23 DIAGNOSIS — F1410 Cocaine abuse, uncomplicated: Secondary | ICD-10-CM | POA: Diagnosis present

## 2016-12-23 DIAGNOSIS — Z7982 Long term (current) use of aspirin: Secondary | ICD-10-CM | POA: Diagnosis not present

## 2016-12-23 DIAGNOSIS — Z881 Allergy status to other antibiotic agents status: Secondary | ICD-10-CM | POA: Diagnosis not present

## 2016-12-23 DIAGNOSIS — I160 Hypertensive urgency: Secondary | ICD-10-CM | POA: Diagnosis present

## 2016-12-23 DIAGNOSIS — I252 Old myocardial infarction: Secondary | ICD-10-CM | POA: Diagnosis not present

## 2016-12-23 DIAGNOSIS — I5042 Chronic combined systolic (congestive) and diastolic (congestive) heart failure: Secondary | ICD-10-CM | POA: Diagnosis present

## 2016-12-23 DIAGNOSIS — G5622 Lesion of ulnar nerve, left upper limb: Secondary | ICD-10-CM | POA: Insufficient documentation

## 2016-12-23 DIAGNOSIS — Z8249 Family history of ischemic heart disease and other diseases of the circulatory system: Secondary | ICD-10-CM | POA: Diagnosis not present

## 2016-12-23 DIAGNOSIS — F17210 Nicotine dependence, cigarettes, uncomplicated: Secondary | ICD-10-CM | POA: Diagnosis present

## 2016-12-23 DIAGNOSIS — L03114 Cellulitis of left upper limb: Secondary | ICD-10-CM | POA: Diagnosis present

## 2016-12-23 DIAGNOSIS — I129 Hypertensive chronic kidney disease with stage 1 through stage 4 chronic kidney disease, or unspecified chronic kidney disease: Secondary | ICD-10-CM | POA: Diagnosis not present

## 2016-12-23 DIAGNOSIS — I7101 Dissection of thoracic aorta: Secondary | ICD-10-CM | POA: Diagnosis present

## 2016-12-23 DIAGNOSIS — I161 Hypertensive emergency: Secondary | ICD-10-CM | POA: Insufficient documentation

## 2016-12-23 DIAGNOSIS — I421 Obstructive hypertrophic cardiomyopathy: Secondary | ICD-10-CM | POA: Diagnosis present

## 2016-12-23 DIAGNOSIS — M79602 Pain in left arm: Secondary | ICD-10-CM | POA: Diagnosis present

## 2016-12-23 DIAGNOSIS — N189 Chronic kidney disease, unspecified: Secondary | ICD-10-CM

## 2016-12-23 DIAGNOSIS — N184 Chronic kidney disease, stage 4 (severe): Secondary | ICD-10-CM | POA: Diagnosis present

## 2016-12-23 DIAGNOSIS — N179 Acute kidney failure, unspecified: Secondary | ICD-10-CM | POA: Diagnosis present

## 2016-12-23 DIAGNOSIS — Z888 Allergy status to other drugs, medicaments and biological substances status: Secondary | ICD-10-CM | POA: Diagnosis not present

## 2016-12-23 DIAGNOSIS — Z955 Presence of coronary angioplasty implant and graft: Secondary | ICD-10-CM | POA: Diagnosis not present

## 2016-12-23 DIAGNOSIS — I2510 Atherosclerotic heart disease of native coronary artery without angina pectoris: Secondary | ICD-10-CM | POA: Diagnosis present

## 2016-12-23 DIAGNOSIS — K5010 Crohn's disease of large intestine without complications: Secondary | ICD-10-CM | POA: Diagnosis present

## 2016-12-23 DIAGNOSIS — M7989 Other specified soft tissue disorders: Secondary | ICD-10-CM | POA: Diagnosis present

## 2016-12-23 DIAGNOSIS — D638 Anemia in other chronic diseases classified elsewhere: Secondary | ICD-10-CM | POA: Diagnosis present

## 2016-12-23 DIAGNOSIS — D696 Thrombocytopenia, unspecified: Secondary | ICD-10-CM | POA: Diagnosis present

## 2016-12-23 LAB — HIV ANTIBODY (ROUTINE TESTING W REFLEX): HIV Screen 4th Generation wRfx: NONREACTIVE

## 2016-12-23 LAB — CBC
HEMATOCRIT: 34.5 % — AB (ref 39.0–52.0)
Hemoglobin: 11.3 g/dL — ABNORMAL LOW (ref 13.0–17.0)
MCH: 28.3 pg (ref 26.0–34.0)
MCHC: 32.8 g/dL (ref 30.0–36.0)
MCV: 86.5 fL (ref 78.0–100.0)
Platelets: 59 10*3/uL — ABNORMAL LOW (ref 150–400)
RBC: 3.99 MIL/uL — AB (ref 4.22–5.81)
RDW: 14.7 % (ref 11.5–15.5)
WBC: 3 10*3/uL — AB (ref 4.0–10.5)

## 2016-12-23 LAB — RAPID URINE DRUG SCREEN, HOSP PERFORMED
AMPHETAMINES: NOT DETECTED
BENZODIAZEPINES: NOT DETECTED
Barbiturates: NOT DETECTED
COCAINE: POSITIVE — AB
Opiates: POSITIVE — AB
Tetrahydrocannabinol: NOT DETECTED

## 2016-12-23 LAB — I-STAT TROPONIN, ED: TROPONIN I, POC: 0.08 ng/mL (ref 0.00–0.08)

## 2016-12-23 LAB — TROPONIN I
TROPONIN I: 0.08 ng/mL — AB (ref <0.03)
Troponin I: 0.06 ng/mL (ref <0.03)
Troponin I: 0.06 ng/mL (ref <0.03)

## 2016-12-23 LAB — BASIC METABOLIC PANEL
Anion gap: 7 (ref 5–15)
BUN: 48 mg/dL — AB (ref 6–20)
CO2: 21 mmol/L — AB (ref 22–32)
CREATININE: 3.74 mg/dL — AB (ref 0.61–1.24)
Calcium: 8.2 mg/dL — ABNORMAL LOW (ref 8.9–10.3)
Chloride: 110 mmol/L (ref 101–111)
GFR calc Af Amer: 21 mL/min — ABNORMAL LOW (ref >60)
GFR, EST NON AFRICAN AMERICAN: 19 mL/min — AB (ref >60)
GLUCOSE: 95 mg/dL (ref 65–99)
Potassium: 4.4 mmol/L (ref 3.5–5.1)
Sodium: 138 mmol/L (ref 135–145)

## 2016-12-23 LAB — GLUCOSE, CAPILLARY: Glucose-Capillary: 101 mg/dL — ABNORMAL HIGH (ref 65–99)

## 2016-12-23 MED ORDER — LORAZEPAM 2 MG/ML IJ SOLN
1.0000 mg | Freq: Once | INTRAMUSCULAR | Status: AC
  Administered 2016-12-23: 1 mg via INTRAVENOUS
  Filled 2016-12-23: qty 1

## 2016-12-23 MED ORDER — ASPIRIN 325 MG PO TABS
325.0000 mg | ORAL_TABLET | Freq: Every day | ORAL | Status: DC
  Administered 2016-12-23 – 2016-12-25 (×3): 325 mg via ORAL
  Filled 2016-12-23 (×3): qty 1

## 2016-12-23 MED ORDER — CLINDAMYCIN PHOSPHATE 600 MG/50ML IV SOLN
600.0000 mg | Freq: Three times a day (TID) | INTRAVENOUS | Status: DC
  Administered 2016-12-23 – 2016-12-25 (×8): 600 mg via INTRAVENOUS
  Filled 2016-12-23 (×10): qty 50

## 2016-12-23 MED ORDER — MINOXIDIL 2.5 MG PO TABS: 2.5000 mg | ORAL_TABLET | Freq: Every day | ORAL | Status: DC

## 2016-12-23 MED ORDER — ENOXAPARIN SODIUM 30 MG/0.3ML ~~LOC~~ SOLN
30.0000 mg | SUBCUTANEOUS | Status: DC
  Administered 2016-12-23: 30 mg via SUBCUTANEOUS
  Filled 2016-12-23 (×2): qty 0.3

## 2016-12-23 MED ORDER — ADULT MULTIVITAMIN W/MINERALS CH
1.0000 | ORAL_TABLET | Freq: Every day | ORAL | Status: DC
  Administered 2016-12-23 – 2016-12-25 (×3): 1 via ORAL
  Filled 2016-12-23 (×3): qty 1

## 2016-12-23 MED ORDER — OXYCODONE-ACETAMINOPHEN 5-325 MG PO TABS
1.0000 | ORAL_TABLET | ORAL | Status: DC | PRN
  Administered 2016-12-23 – 2016-12-24 (×5): 1 via ORAL
  Filled 2016-12-23 (×5): qty 1

## 2016-12-23 MED ORDER — LABETALOL HCL 5 MG/ML IV SOLN
10.0000 mg | Freq: Once | INTRAVENOUS | Status: AC
Start: 2016-12-23 — End: 2016-12-23
  Administered 2016-12-23: 10 mg via INTRAVENOUS
  Filled 2016-12-23: qty 4

## 2016-12-23 MED ORDER — TRAMADOL HCL 50 MG PO TABS: 50.0000 mg | ORAL_TABLET | Freq: Two times a day (BID) | ORAL | Status: DC | PRN

## 2016-12-23 MED ORDER — SODIUM CHLORIDE 0.9% FLUSH
3.0000 mL | Freq: Two times a day (BID) | INTRAVENOUS | Status: DC
  Administered 2016-12-23 – 2016-12-25 (×4): 3 mL via INTRAVENOUS

## 2016-12-23 MED ORDER — ZOLPIDEM TARTRATE 5 MG PO TABS: 5.0000 mg | ORAL_TABLET | Freq: Every evening | ORAL | Status: DC | PRN

## 2016-12-23 MED ORDER — NICOTINE 21 MG/24HR TD PT24
21.0000 mg | MEDICATED_PATCH | Freq: Every day | TRANSDERMAL | Status: DC
  Filled 2016-12-23 (×3): qty 1

## 2016-12-23 MED ORDER — ONDANSETRON HCL 4 MG/2ML IJ SOLN: 4.0000 mg | Freq: Three times a day (TID) | INTRAMUSCULAR | Status: DC | PRN

## 2016-12-23 MED ORDER — CLONIDINE HCL 0.1 MG PO TABS
0.3000 mg | ORAL_TABLET | Freq: Two times a day (BID) | ORAL | Status: DC
  Administered 2016-12-23 (×2): 0.3 mg via ORAL
  Filled 2016-12-23 (×2): qty 3

## 2016-12-23 MED ORDER — SODIUM CHLORIDE 0.45 % IV SOLN
INTRAVENOUS | Status: AC
  Administered 2016-12-23: 09:00:00 via INTRAVENOUS

## 2016-12-23 MED ORDER — AMLODIPINE BESYLATE 10 MG PO TABS
10.0000 mg | ORAL_TABLET | Freq: Every day | ORAL | Status: DC
  Administered 2016-12-23 – 2016-12-25 (×3): 10 mg via ORAL
  Filled 2016-12-23: qty 2
  Filled 2016-12-23 (×3): qty 1

## 2016-12-23 MED ORDER — LABETALOL HCL 300 MG PO TABS
300.0000 mg | ORAL_TABLET | Freq: Three times a day (TID) | ORAL | Status: DC
  Administered 2016-12-23 – 2016-12-25 (×6): 300 mg via ORAL
  Filled 2016-12-23 (×7): qty 1

## 2016-12-23 MED ORDER — ACETAMINOPHEN 500 MG PO TABS: 500.0000 mg | ORAL_TABLET | Freq: Four times a day (QID) | ORAL | Status: DC | PRN

## 2016-12-23 MED ORDER — OXYCODONE-ACETAMINOPHEN 5-325 MG PO TABS
1.0000 | ORAL_TABLET | Freq: Once | ORAL | Status: AC
  Administered 2016-12-23: 1 via ORAL
  Filled 2016-12-23: qty 1

## 2016-12-23 MED ORDER — LABETALOL HCL 5 MG/ML IV SOLN
20.0000 mg | INTRAVENOUS | Status: DC | PRN
  Filled 2016-12-23: qty 4

## 2016-12-23 MED ORDER — DIPHENHYDRAMINE HCL 50 MG/ML IJ SOLN: 25.0000 mg | Freq: Three times a day (TID) | INTRAMUSCULAR | Status: DC | PRN

## 2016-12-23 MED ORDER — LABETALOL HCL 200 MG PO TABS
200.0000 mg | ORAL_TABLET | Freq: Three times a day (TID) | ORAL | Status: DC
  Administered 2016-12-23 (×2): 200 mg via ORAL
  Filled 2016-12-23 (×4): qty 1

## 2016-12-23 MED ORDER — CLONIDINE HCL 0.1 MG PO TABS
0.3000 mg | ORAL_TABLET | Freq: Three times a day (TID) | ORAL | Status: DC
  Administered 2016-12-23 – 2016-12-25 (×6): 0.3 mg via ORAL
  Filled 2016-12-23 (×6): qty 3

## 2016-12-23 MED ORDER — MINOXIDIL 2.5 MG PO TABS
2.5000 mg | ORAL_TABLET | Freq: Every day | ORAL | Status: DC
  Administered 2016-12-23 – 2016-12-25 (×3): 2.5 mg via ORAL
  Filled 2016-12-23 (×3): qty 1

## 2016-12-23 NOTE — Progress Notes (Signed)
Patient's BP 178/110. Md paged. Received orders. Will continue to monitor the patient.

## 2016-12-23 NOTE — ED Notes (Signed)
Informed Dr. Erma Heritage of troponin of 0.08 @ [phone] by QA

## 2016-12-23 NOTE — Consult Note (Addendum)
Referring Physician: Dr Angela Cox ER  Patient name: Blake Gonzalez MRN: [id number] DOB: 1976-03-17 Sex: male  REASON FOR CONSULT: left forearm shoulder pain post aortic dissection repair  HPI: Blake Gonzalez is a 41 y.o. male who previously had repair of a type B dissection at [facility] about 1 year ago.  He has had pain and swelling in his left forearm and hand for about 2 weeks.  He was treated with Clindamycin.  He feels it is getting worse and now has some numbness of left volar forearm and ulnar aspect of hand. He denies current fever or chills but states he did have a fever of 101 about 10 days ago.  He denies any IV drug use.  He denies any recent illicit drug use with the exception of marijuana.  However, he tested positive for cocaine 2 weeks ago on urine drug screen.  Other medical problems include hypertension which is poorly controlled today.  He has chronic kidney disease and has a creatinine of just under 4 today which is around the same as 2 weeks ago.  He also has Hep C.  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  He denies any trauma to the left hand or arm.  He states he has some mild shoulder pain but bulk of pain/numbness is forearm/hand.   Past Medical History:  Diagnosis Date  . CKD (chronic kidney disease), stage III   . Cocaine abuse    Started using after divorce; no use around time of MI  . Crohn's colitis ([place])    Currently denies  . Hepatitis C    01/17/2012 labs  . HOCM (hypertrophic obstructive cardiomyopathy) ([place])   . Hypertension    Multiple Hospitalizations  . Kidney stones   . Myocardial infarction    In [place], [place]  . Type 2 dissection of thoracic aorta [facility])    Past Surgical History:  Procedure Laterality Date  . CARDIAC CATHETERIZATION  2011  . CORONARY ANGIOPLASTY WITH STENT PLACEMENT  2011  . LITHOTRIPSY    . OTHER SURGICAL HISTORY  2011   Percutaneous biliary tube  . RENAL BIOPSY  2000  . SMALL INTESTINE  SURGERY    . THORACIC AORTIC ANEURYSM REPAIR  12/2015   At [facility]    Family History  Problem Relation Age of Onset  . Heart disease Father     early death at age 6  . Diabetes Maternal Aunt   . Cancer      SOCIAL HISTORY: Social History   Social History  . Marital status: Single    Spouse name: N/A  . Number of children: N/A  . Years of education: N/A   Occupational History  . Not on file.   Social History Main Topics  . Smoking status: Current Every Day Smoker    Packs/day: 0.50    Types: Cigarettes  . Smokeless tobacco: Never Used  . Alcohol use No  . Drug use: No     Comment: Hx of Cocaine Abuse; Clean 6-7 months  . Sexual activity: Not on file   Other Topics Concern  . Not on file   Social History Narrative   Divorced   Lives with Girlfriend in [place] and [place]   52 yo and 2 yo Sons          Allergies  Allergen Reactions  . Vancomycin     Itching Rash MD verbalizes "  Red Man Syndrome"  . Hydralazine Hypertension    Current Facility-Administered Medications  Medication Dose Route Frequency Provider Last Rate Last Dose  . nicardipine (CARDENE) 9m in 0.86% saline 2060mIV infusion (0.1 mg/ml)  3-15 mg/hr Intravenous Continuous CaDuffy BruceMD 75 mL/hr at 12/23/16 0033 7.5 mg/hr at 12/23/16 0033   Current Outpatient Prescriptions  Medication Sig Dispense Refill  . acetaminophen (TYLENOL) 500 MG tablet Take 500 mg by mouth every 6 (six) hours as needed (pain).    . Marland KitchenmLODipine (NORVASC) 10 MG tablet Take 10 mg by mouth daily.     . Marland Kitchenspirin 325 MG tablet Take 325 mg by mouth every morning.     . cloNIDine (CATAPRES) 0.2 MG tablet Take 1 tablet (0.2 mg total) by mouth 3 (three) times daily. (Patient taking differently: Take 0.2 mg by mouth at bedtime. ) 90 tablet 0  . cloNIDine (CATAPRES) 0.3 MG tablet Take 0.3 mg by mouth 2 (two) times daily.    . Marland Kitchenabetalol (NORMODYNE) 200 MG tablet Take 200 mg by mouth 3 (three) times daily.       . Multiple Vitamin (MULTIVITAMIN WITH MINERALS) TABS tablet Take 1 tablet by mouth every morning.     . traMADol (ULTRAM) 50 MG tablet Take 1 tablet (50 mg total) by mouth every 12 (twelve) hours as needed. 10 tablet 0  . clindamycin (CLEOCIN) 150 MG capsule Take 2 capsules (300 mg total) by mouth 4 (four) times daily. (Patient not taking: Reported on 12/22/2016) 56 capsule 0  . doxycycline (VIBRAMYCIN) 100 MG capsule Take 100 mg by mouth 2 (two) times daily.    . nitroGLYCERIN 0.2 mg/mL infusion Inject 0-200 mcg/min into the vein continuous.      ROS:   General:  No weight loss, Fever, chills  HEENT: No recent headaches, no nasal bleeding, no visual changes, no sore throat  Neurologic: No dizziness, blackouts, seizures. No recent symptoms of stroke or mini- stroke. No recent episodes of slurred speech, or temporary blindness.  Cardiac: No recent episodes of chest pain/pressure, no shortness of breath at rest.  No shortness of breath with exertion.  Denies history of atrial fibrillation or irregular heartbeat  Vascular: No history of rest pain in feet.  No history of claudication.  No history of non-healing ulcer, No history of DVT   Pulmonary: No home oxygen, no productive cough, no hemoptysis,  No asthma or wheezing  Musculoskeletal:  '[ ]'  Arthritis, '[ ]'  Low back pain,  '[ ]'  Joint pain  Hematologic:No history of hypercoagulable state.  No history of easy bleeding.  No history of anemia  Gastrointestinal: No hematochezia or melena,  No gastroesophageal reflux, no trouble swallowing  Urinary: '[X]'  chronic Kidney disease, '[ ]'  on HD - '[]'  MWF or '[ ]'  TTHS, '[ ]'  Burning with urination, '[ ]'  Frequent urination, '[ ]'  Difficulty urinating;   Skin: No rashes  Psychological: No history of anxiety,  No history of depression   Physical Examination  Vitals:   12/22/16 2330 12/23/16 0016 12/23/16 0029 12/23/16 0030  BP: (!) 181/127 (!) 173/118 (!) 173/118 (!) 179/124  Pulse: 72  74 78  Resp: '18   16 14  ' Temp:      TempSrc:      SpO2: 97%  97% 98%  Weight:      Height:        Body mass index is 19.94 kg/m.  General:  Alert and oriented, no acute distress HEENT: Normal Neck: No bruit or JVD Pulmonary:  Clear to auscultation bilaterally Cardiac: Regular Rate and Rhythm  Abdomen: Soft, non-tender, non-distended, no mass, well healed midline scar Skin: erythematous papular rash left forearm volar aspect and hand with edema on the lateral forearm and dorsal hand, mildly tender to palpation, no mass or fluctuance Extremity Pulses:  2+ radial, brachial, femoral, dorsalis pedis, posterior tibial pulses bilaterally Musculoskeletal: No deformity or edema except as mentioned above  Neurologic: Upper and lower extremity motor 5/5 and symmetric  DATA:  CT chest  BMET    Component Value Date/Time   NA 137 12/22/2016 1802   K 4.7 12/22/2016 1802   CL 110 12/22/2016 1802   CO2 21 (L) 12/22/2016 1802   GLUCOSE 104 (H) 12/22/2016 1802   BUN 51 (H) 12/22/2016 1802   CREATININE 3.96 (H) 12/22/2016 1802   CALCIUM 8.4 (L) 12/22/2016 1802   GFRNONAA 17 (L) 12/22/2016 1802   GFRAA 20 (L) 12/22/2016 1802   CBC    Component Value Date/Time   WBC 3.7 (L) 12/22/2016 1802   RBC 4.12 (L) 12/22/2016 1802   HGB 11.9 (L) 12/22/2016 1802   HCT 36.4 (L) 12/22/2016 1802   PLT 61 (L) 12/22/2016 1802   MCV 88.3 12/22/2016 1802   MCH 28.9 12/22/2016 1802   MCHC 32.7 12/22/2016 1802   RDW 14.8 12/22/2016 1802   LYMPHSABS 0.9 12/22/2016 1802   MONOABS 0.2 12/22/2016 1802   EOSABS 0.2 12/22/2016 1802   BASOSABS 0.0 12/22/2016 1802   CRP 1.5 ESR normal   Chest CT: no expansion of dissection, no perigraft fluid or gas, ? Enlargement of venous collaterals left shoulder region  ASSESSMENT:  Left forearm swelling with erythema suggestive of cellulitis.  I do not know the etiology of the numbness in his left forearm related to this process.  He does not appear to have a compartment syndrome.  His  stent graft is in good position without obvious evidence of infection.  DVT US showed no left arm DVT.  No evidence of dissection progression.   PLAN:  1. Remote chance this represents process related to his stent graft but would try to image with MRA for completeness in light of pt poor renal function  2. Would treat left forearm for cellulitis doubt this is related to stent graft.  3. Would repeat urine drug screen since hypertensive and prior cocaine positive a few weeks ago   4. If workup progresses to point that stent graft is thought to be infected can transfer to [facility] although currently I do not believe we have evidence of this.   Ruta Hinds, MD [facility] Office: [phone] Pager: [phone]

## 2016-12-23 NOTE — ED Notes (Signed)
Pt placed on cardiac monitor 

## 2016-12-23 NOTE — Progress Notes (Signed)
Patients BP 200/114. Pulse 62. MD paged. Patient in no other distress. Received new orders. Will continue to monitor patient.

## 2016-12-23 NOTE — Progress Notes (Signed)
[facility] PROGRESS NOTE  Blake Gonzalez YQM:[id number] DOB: 1976/07/10 DOA: 12/22/2016  PCP: Pcp Not In System  Brief History/Interval Summary: 41 y.o. male with medical history significant of s/p of repair of a type B dissection at [facility] about 1 year ago, CAD, stent placement,  HOCM, HCV, Crohn's disease, cocaine abuse, tobacco abuse, sCHF, CKD-IV, who presented with left arm swelling, pain and numbness. Patient was recently diagnosed with cellulitis on April 2 and was treated with clindamycin. His redness has improved. However, swelling persisted. Of note, pt was initially started with IV vancomycin in ED, but he developed red man syndrome, then vancomycin was discontinued. Patient underwent upper extremity venous Doppler which was negative for DVT. However, revealed low level echoes seen near a valve at the proximal left IJV with unknown etiology. Patient also noted to have malignant hypertension. He was subsequently hospitalized for further management.  Reason for Visit: Malignant hypertension. Left arm swelling.  Consultants: Vascular surgery  Procedures:  Left upper extremity venous Doppler Preliminary findings: The left upper extremity is negative for acute thrombosis in the visualized veins.  Low level echoes seen near a valve at the proximal left IJV - unknown etiology.  Antibiotics: Intravenous clindamycin  Subjective/Interval History: Patient continues to complain of numbness along the ulnar aspect of his forearm and hand. Denies any chest pain, shortness of breath. Denies noncompliance with blood pressure medications.  ROS: Denies any nausea or vomiting  Objective:  Vital Signs  Vitals:   12/23/16 0245 12/23/16 0300 12/23/16 0332 12/23/16 0521  BP: 123/83 132/89 (!) 141/80 (!) 149/81  Pulse: 74 72 67 (!) 59  Resp: Temp:   98 F (36.7 C) 97.7 F (36.5 C)  TempSrc:   Oral Oral  SpO2: 92% 95% 96% 98%  Weight:      Height:         Intake/Output Summary (Last 24 hours) at 12/23/16 1206 Last data filed at 12/23/16 5284  Gross per 24 hour  Intake            830.5 ml  Output                0 ml  Net            830.5 ml   Filed Weights   12/22/16 1516  Weight: 66.7 kg (147 lb)    General appearance: alert, cooperative, appears stated age and no distress Resp: clear to auscultation bilaterally Cardio: regular rate and rhythm, S1, S2 normal, no murmur, click, rub or gallop GI: soft, non-tender; bowel sounds normal; no masses,  no organomegaly Extremities: No erythema noted over the left hand area. He has good radial pulses. Good range of motion. Some swelling is noted. Neurologic: Cranial nerves II 12 intact. Motor strength equal bilateral upper and lower extremity.  Lab Results:  Data Reviewed: I have personally reviewed following labs and imaging studies  CBC:  Recent Labs Lab 12/22/16 1802 12/23/16 0815  WBC 3.7* 3.0*  NEUTROABS 2.4  --   HGB 11.9* 11.3*  HCT 36.4* 34.5*  MCV 88.3 86.5  PLT 61* 59*    Basic Metabolic Panel:  Recent Labs Lab 12/22/16 1802 12/23/16 0815  NA 137 138  K 4.7 4.4  CL 110 110  CO2 21* 21*  GLUCOSE 104* 95  BUN 51* 48*  CREATININE 3.96* 3.74*  CALCIUM 8.4* 8.2*    GFR: Estimated Creatinine Clearance: 24.3 mL/min (A) (by C-G formula based on SCr of  3.74 mg/dL (H)).  Liver Function Tests:  Recent Labs Lab 12/22/16 1802  AST 45*  ALT 20  ALKPHOS 167*  BILITOT 0.7  PROT 6.7  ALBUMIN 3.3*    Cardiac Enzymes:  Recent Labs Lab 12/23/16 0210 12/23/16 0815  TROPONINI 0.08* 0.06*   CBG:  Recent Labs Lab 12/23/16 0725  GLUCAP 101*    Radiology Studies: Dg Forearm Left  Result Date: 12/22/2016 CLINICAL DATA:  Worsening LEFT arm cellulitis, completed antibiotic course. EXAM: LEFT WRIST - COMPLETE 3+ VIEW; LEFT FOREARM - 2 VIEW COMPARISON:  LEFT wrist radiographs December 15, 2016 FINDINGS: No acute fracture deformity or dislocation. Joint spaces  intact without erosions. No destructive bony lesions. Dorsal wrist and hand soft tissue swelling without subcutaneous gas or radiopaque foreign bodies. IMPRESSION: Soft tissue swelling without acute osseous process. Electronically Signed   By: Awilda Metro M.D.   On: 12/22/2016 17:23   Dg Wrist Complete Left  Result Date: 12/22/2016 CLINICAL DATA:  Worsening LEFT arm cellulitis, completed antibiotic course. EXAM: LEFT WRIST - COMPLETE 3+ VIEW; LEFT FOREARM - 2 VIEW COMPARISON:  LEFT wrist radiographs December 15, 2016 FINDINGS: No acute fracture deformity or dislocation. Joint spaces intact without erosions. No destructive bony lesions. Dorsal wrist and hand soft tissue swelling without subcutaneous gas or radiopaque foreign bodies. IMPRESSION: Soft tissue swelling without acute osseous process. Electronically Signed   By: Awilda Metro M.D.   On: 12/22/2016 17:23   Ct Chest Wo Contrast  Result Date: 12/22/2016 CLINICAL DATA:  Left arm pain with numbness. History of aortic dissection and aneurysm repair. EXAM: CT CHEST WITHOUT CONTRAST TECHNIQUE: Multidetector CT imaging of the chest was performed following the standard protocol without IV contrast. COMPARISON:  10/11/2016 CT, 12/15/2016 CXR FINDINGS: Cardiovascular: Stable cardiomegaly without significant pericardial effusion or thickening. Coronary arteriosclerosis is again visualized with coronary artery stent. Chronic stable 4.1 cm ascending aortic aneurysm unchanged in appearance. An aortic endovascular graft is seen starting in the proximal aortic arch at intake of the left subclavian artery and along the descending thoracic aorta into the level the diaphragmatic hiatus where there is chronic stable tortuous appearance artery aorta noted. Chronic stable mild dilatation of the main pulmonary artery to 3.6 cm. Further evaluation of aorta and pulmonary vessels is limited on this noncontrast study. No evidence aneurysm leak or intramural hematoma.  Partially calcified mural thrombus is seen along the distal aortic arch as before. Suprarenal aneurysmal dilatation of the aorta is seen at the diaphragmatic hiatus measuring up to 5.7 cm versus 5.4 cm previously, some of this is due to slight operator dependent imaging differences. Mediastinum/Nodes: Multiple small calcified mediastinal lymph nodes are noted consistent with old granulomatous disease. The esophagus and thyroid are grossly unremarkable. No hilar nor mediastinal adenopathy. Lungs/Pleura: Bilateral scattered pulmonary nodules are in keeping with old granulomatous disease and stable in appearance. No focal pulmonary consolidation, effusion or pneumothorax. Upper Abdomen: Splenic granulomas are also present with stable splenomegaly. Mild atrophy of the kidneys with perinephric fat stranding. Nonobstructing calculi noted of the left kidney with faint nephrocalcinosis suggested. Musculoskeletal: No chest wall mass or suspicious bone lesions identified. No axillary mass or adenopathy to account for the left shoulder pain and numbness. IMPRESSION: 1. No acute cardiopulmonary disease. Calcified pulmonary nodules, mediastinal calcified lymph nodes and splenic calcifications are consistent old granulomatous disease. 2. Cardiomegaly with coronary arteriosclerosis and coronary arterial stenting. No significant change in the appearance of the diffuse dilatation of the descending thoracic and upper abdominal aorta compatible with  known type B dissection with endovascular stent graft along the descending thoracic aorta. Electronically Signed   By: Tollie Eth M.D.   On: 12/22/2016 21:42   Ct Cervical Spine Wo Contrast  Result Date: 12/22/2016 CLINICAL DATA:  Acute onset of left arm pain and numbness, with erythema. Initial encounter. EXAM: CT CERVICAL SPINE WITHOUT CONTRAST TECHNIQUE: Multidetector CT imaging of the cervical spine was performed without intravenous contrast. Multiplanar CT image reconstructions  were also generated. COMPARISON:  None. FINDINGS: Alignment: Normal. Skull base and vertebrae: No acute fracture. No primary bone lesion or focal pathologic process. Soft tissues and spinal canal: No prevertebral fluid or swelling. No visible canal hematoma. Disc levels: Multilevel disc space narrowing is noted at the mid to lower cervical spine, with minimal vacuum phenomenon noted at C6-C7. Scattered anterior posterior disc osteophyte complexes are noted at the lower cervical spine. Upper chest: The patient's large aortic arch and descending thoracic aorta aneurysm is better characterized on concurrent CT of the chest. A calcified granuloma is noted at the left lung apex. The thyroid gland is unremarkable in appearance. Other: The minimally visualized portions of the brain are unremarkable. IMPRESSION: 1. No evidence of fracture or subluxation along the cervical spine. 2. Mild degenerative change at the mid to lower cervical spine. 3. Large aortic arch and descending thoracic aortic aneurysm is better characterized on concurrent CT of the chest. Electronically Signed   By: Roanna Raider M.D.   On: 12/22/2016 21:29     Medications:  Scheduled: . amLODipine  10 mg Oral Daily  . aspirin  325 mg Oral Daily  . clindamycin (CLEOCIN) IV  600 mg Intravenous Q8H  . cloNIDine  0.3 mg Oral BID  . enoxaparin (LOVENOX) injection  30 mg Subcutaneous Q24H  . labetalol  200 mg Oral TID  . multivitamin with minerals  1 tablet Oral Daily  . nicotine  21 mg Transdermal Daily  . sodium chloride flush  3 mL Intravenous Q12H   Continuous: . 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 Patient was treated with labetalol 20 mg 1, clonidine 0.2 mg in ED, with only mild improvement. His home medications were resumed. He was briefly started on a Cardene infusion. Subsequently blood pressure improved. Currently on home medication regimen including amlodipine, clonidine and labetalol. Apparently has been on minoxidil in the past. Patient also has a history of cocaine abuse, which could be contributing to elevated blood pressure. Urine drug screen is pending from this hospitalization. His compliance is questionable. It is noted that the patient did leave AGAINST MEDICAL ADVICE when he was hospitalized in March.  Left arm swelling, tenderness, erythema and numbness Recently diagnosed with cellulitis and treated with clindamycin. Cellulitis appears to have improved. Reason for his symptoms are not entirely clear. Upper extremity Doppler study showed nonspecific findings. Patient seen by vascular surgery. He does have good pulses. MRA Chest was recommended to evaluate the graft and vasculature. MRI brain and MRI cervical spine also ordered to rule out any intracranial or cervical spine lesion as a reason for his  numbness. Placed on IV clindamycin which will be continued for now. Keep left arm elevated.  Acute on CKD stage 4 secondary to hypertension Baseline creatinine appears to be between 2.5 and 3. Over the last few months it has significantly worsened. They could be an element of hypovolemia. Cocaine use is also contributing. He'll be gently hydrated. Monitor  urine output. Repeat labs tomorrow. Patient tells me that his PCP has suggested outpatient nephrology consult.   History of Type 2 dissection of thoracic aorta S/p repair 12/2015 at [facility]. Seems stable on CT-chest. MRA is pending.  Thrombocytopenia This is a chronic issue. No bleeding tendency.  Etiology is not clear.  Tobacco abuse and cocaine abuse Nicotine patch. Check UDS  DVT Prophylaxis: Lovenox    Code Status: Full code  Family Communication: Discussed with the patient  Disposition Plan: Await MRI brain, cervical spine and MRA chest.    LOS: 0 days   [facility]  [facility] Pager [phone] 12/23/2016, 12:06 PM  If 7PM-7AM, please contact night-coverage at [email or web address], password [facility]

## 2016-12-23 NOTE — ED Notes (Signed)
Pt reports itching has ceased since benadryl administration.

## 2016-12-24 DIAGNOSIS — F1410 Cocaine abuse, uncomplicated: Secondary | ICD-10-CM

## 2016-12-24 DIAGNOSIS — N184 Chronic kidney disease, stage 4 (severe): Secondary | ICD-10-CM

## 2016-12-24 DIAGNOSIS — I10 Essential (primary) hypertension: Secondary | ICD-10-CM

## 2016-12-24 DIAGNOSIS — I129 Hypertensive chronic kidney disease with stage 1 through stage 4 chronic kidney disease, or unspecified chronic kidney disease: Secondary | ICD-10-CM

## 2016-12-24 DIAGNOSIS — D696 Thrombocytopenia, unspecified: Secondary | ICD-10-CM

## 2016-12-24 LAB — GLUCOSE, CAPILLARY: GLUCOSE-CAPILLARY: 87 mg/dL (ref 65–99)

## 2016-12-24 MED ORDER — FUROSEMIDE 10 MG/ML IJ SOLN
80.0000 mg | Freq: Two times a day (BID) | INTRAMUSCULAR | Status: DC
  Administered 2016-12-24 – 2016-12-25 (×2): 80 mg via INTRAVENOUS
  Filled 2016-12-24 (×2): qty 8

## 2016-12-24 MED ORDER — MINOXIDIL 2.5 MG PO TABS
2.5000 mg | ORAL_TABLET | Freq: Once | ORAL | Status: AC
  Administered 2016-12-24: 2.5 mg via ORAL
  Filled 2016-12-24: qty 1

## 2016-12-24 MED ORDER — MORPHINE SULFATE (PF) 2 MG/ML IV SOLN
0.5000 mg | Freq: Once | INTRAVENOUS | Status: AC
  Administered 2016-12-24: 0.5 mg via INTRAVENOUS
  Filled 2016-12-24: qty 1

## 2016-12-24 MED ORDER — OXYCODONE-ACETAMINOPHEN 5-325 MG PO TABS
1.0000 | ORAL_TABLET | ORAL | Status: DC | PRN
  Administered 2016-12-24 – 2016-12-25 (×6): 1 via ORAL
  Filled 2016-12-24 (×6): qty 1

## 2016-12-24 NOTE — Progress Notes (Addendum)
Patient ID: Blake Gonzalez, male   DOB: 1975-10-10, 41 y.o.   MRN: [id number]  PROGRESS NOTE    Blake Gonzalez  WUJ:[id number] DOB: September 30, 1975 DOA: 12/22/2016  PCP: Pcp Not In System   Brief Narrative:  41 y.o.malewith medical history significant for repair of type B dissection at [facility] about 1 year ago,CAD, stent placement, HOCM, HCV, Crohn's disease,cocaine abuse, tobacco abuse, sCHF, CKD-IV, who presented with left arm swelling, pain and numbness. Patient was recently diagnosed with cellulitis on April 2 and was treated with clindamycin. His redness has improved. However, swelling persisted. Of note, pt was initially started with IV vancomycin in ED, but he developed red man syndrome, then vancomycin was discontinued. Patient underwent upper extremity venous Doppler which was negative for DVT. However, revealed low level echoes seen near a valve at the proximal left IJV with unknownetiology. Patient also noted to have malignant hypertension. He was subsequently hospitalized for further management.  Assessment & Plan:   Accelerated hypertension - Likely in the setting of cocaine abuse - Patient was treated with labetalol 20 mg 1, clonidine 0.2 mg in ED, with only mild improvement - He was briefly started on a Cardene infusion - Currently on multiple meds and SBP still in 200  Left arm swelling, tenderness, erythema and numbness - Recently diagnosed with cellulitis and treated with clindamycin - Cellulitis appears to have improved - Continue IV clinda  - Upper extremity Doppler study showed nonspecific findings - Patient seen by vascular surgery - He does have good pulses - MRA chest was recommended to evaluate the graft and vasculature (would not be a good study due to pt previous history of surgery). MRI brain and MRI cervical spine also ordered to rule out any intracranial or cervical spine lesion as a reason for his numbness. No findings to explain ongoing numbness in his hand -  If no improvement by tomorrow we will order MRI of the arm.  Mild troponin elevation - Likely due to accelerated hypertension and cocaine a - No reports of chest pain  Acute on CKD stage 4 secondary to hypertension - Baseline creatinine appears to be between 2.5 and 3. Over the last few months it has significantly worsened. - Worsening creatinine likely in the setting of cocaine abuse - Continue to monitor renal function daily  History of Type 2 dissection of thoracic aorta - S/p repair 12/2015 at [facility]. Stable on CT-chest  Thrombocytopenia / Anemia of chronic disease/ Leukopenia  - Secondary to bone marrow suppression from history of HCV - Stop Lovenox subcutaneous and use SCDs for DVT prophylaxis  Tobacco abuse and cocaine abuse - Counseled on smoking cessation and drug abuse - Urine drug screen positive for opiates and cocaine   DVT prophylaxis: Lovenox subcutaneous, we'll stop it because of thrombocytopenia, use SCDs Code Status: full code  Family Communication: no family at the bedside Disposition Plan: home when cellulitis improves   Consultants:   Vascular surgery, Dr. Fabienne Bruns  Cardiology   Procedures:   Left upper extremity venous Doppler Preliminary findings: The left upper extremity is negative for acute thrombosis in the visualized veins. Low level echoes seen near a valve at the proximal left IJV - unknown etiology.  Antimicrobials:   Clindamycin 12/22/2016 -->   Subjective: Patient complains of left arm pain, numbness.  Objective: Vitals:   12/23/16 1719 12/23/16 2127 12/24/16 0705 12/24/16 0950  BP: (!) 190/110 (!) 170/130 (!) 150/120 (!) 205/115  Pulse: 62 65 (!) 56 66  Resp:  16 18   Temp: 98.4 F (36.9 C) 98.1 F (36.7 C) 98 F (36.7 C)   TempSrc: Oral Oral Oral   SpO2: 97% 100% 99%   Weight:      Height:        Intake/Output Summary (Last 24 hours) at 12/24/16 1013 Last data filed at 12/24/16 0730  Gross per 24 hour    Intake           1219.5 ml  Output             1225 ml  Net             -5.5 ml   Filed Weights   12/22/16 1516  Weight: 66.7 kg (147 lb)    Examination:  General exam: Appears calm and comfortable  Respiratory system: Clear to auscultation. Respiratory effort normal. Cardiovascular system: S1 & S2 heard, RRR. No JVD, murmurs, rubs, gallops or clicks. No pedal edema. Gastrointestinal system: Abdomen is nondistended, soft and nontender. No organomegaly or masses felt. Normal bowel sounds heard. Central nervous system: Alert and oriented. No focal neurological deficits. Extremities: left arm still little more swollen than right, erythema present but not so prominent  Skin: warm, dry Psychiatry: Judgement and insight appear normal. Mood & affect appropriate.   Data Reviewed: I have personally reviewed following labs and imaging studies  CBC:  Recent Labs Lab 12/22/16 1802 12/23/16 0815  WBC 3.7* 3.0*  NEUTROABS 2.4  --   HGB 11.9* 11.3*  HCT 36.4* 34.5*  MCV 88.3 86.5  PLT 61* 59*   Basic Metabolic Panel:  Recent Labs Lab 12/22/16 1802 12/23/16 0815  NA 137 138  K 4.7 4.4  CL 110 110  CO2 21* 21*  GLUCOSE 104* 95  BUN 51* 48*  CREATININE 3.96* 3.74*  CALCIUM 8.4* 8.2*   GFR: Estimated Creatinine Clearance: 24.3 mL/min (A) (by C-G formula based on SCr of 3.74 mg/dL (H)). Liver Function Tests:  Recent Labs Lab 12/22/16 1802  AST 45*  ALT 20  ALKPHOS 167*  BILITOT 0.7  PROT 6.7  ALBUMIN 3.3*   No results for input(s): LIPASE, AMYLASE in the last 168 hours. No results for input(s): AMMONIA in the last 168 hours. Coagulation Profile: No results for input(s): INR, PROTIME in the last 168 hours. Cardiac Enzymes:  Recent Labs Lab 12/23/16 0210 12/23/16 0815 12/23/16 1345  TROPONINI 0.08* 0.06* 0.06*   BNP (last 3 results) No results for input(s): PROBNP in the last 8760 hours. HbA1C: No results for input(s): HGBA1C in the last 72  hours. CBG:  Recent Labs Lab 12/23/16 0725 12/24/16 0725  GLUCAP 101* 87   Lipid Profile: No results for input(s): CHOL, HDL, LDLCALC, TRIG, CHOLHDL, LDLDIRECT in the last 72 hours. Thyroid Function Tests: No results for input(s): TSH, T4TOTAL, FREET4, T3FREE, THYROIDAB in the last 72 hours. Anemia Panel: No results for input(s): VITAMINB12, FOLATE, FERRITIN, TIBC, IRON, RETICCTPCT in the last 72 hours. Urine analysis:    Component Value Date/Time   COLORURINE YELLOW 12/03/2016 2253   APPEARANCEUR CLEAR 12/03/2016 2253   LABSPEC 1.016 12/03/2016 2253   PHURINE 5.0 12/03/2016 2253   GLUCOSEU NEGATIVE 12/03/2016 2253   HGBUR MODERATE (A) 12/03/2016 2253   BILIRUBINUR NEGATIVE 12/03/2016 2253   KETONESUR NEGATIVE 12/03/2016 2253   PROTEINUR >=300 (A) 12/03/2016 2253   UROBILINOGEN 0.2 01/30/2012 0204   NITRITE NEGATIVE 12/03/2016 2253   LEUKOCYTESUR NEGATIVE 12/03/2016 2253   Sepsis Labs: (procalcitonin:4,lacticidven:4)   Recent Results (from the past  240 hour(s))  Culture, blood (routine x 2)     Status: None (Preliminary result)   Collection Time: 12/22/16  6:25 PM  Result Value Ref Range Status   Specimen Description BLOOD RIGHT HAND  Final   Special Requests   Final    BOTTLES DRAWN AEROBIC AND ANAEROBIC Blood Culture adequate volume   Culture   Final    NO GROWTH 1 DAY Performed at [facility], [address], [place], [place] [address]    Report Status PENDING  Incomplete  Culture, blood (routine x 2)     Status: None (Preliminary result)   Collection Time: 12/23/16  2:12 AM  Result Value Ref Range Status   Specimen Description BLOOD RIGHT HAND  Final   Special Requests   Final    BOTTLES DRAWN AEROBIC AND ANAEROBIC Blood Culture adequate volume   Culture   Final    NO GROWTH 1 DAY Performed at [facility], [address] [place], [place] [address]    Report Status PENDING  Incomplete      Radiology Studies: Dg Forearm  Left Result Date: 12/22/2016 Soft tissue swelling without acute osseous process.   Dg Wrist Complete Left Result Date: 12/22/2016 Soft tissue swelling without acute osseous process.   Ct Chest Wo Contrast Result Date: 12/22/2016 1. No acute cardiopulmonary disease. Calcified pulmonary nodules, mediastinal calcified lymph nodes and splenic calcifications are consistent old granulomatous disease. 2. Cardiomegaly with coronary arteriosclerosis and coronary arterial stenting. No significant change in the appearance of the diffuse dilatation of the descending thoracic and upper abdominal aorta compatible with known type B dissection with endovascular stent graft along the descending thoracic aorta.   Ct Cervical Spine Wo Contrast Result Date: 12/22/2016 1. No evidence of fracture or subluxation along the cervical spine. 2. Mild degenerative change at the mid to lower cervical spine. 3. Large aortic arch and descending thoracic aortic aneurysm is better characterized on concurrent CT of the chest.  Mr Brain Wo Contrast Result Date: 12/23/2016 MRI HEAD:  Negative moderately motion degraded MRI of the head. MRI CERVICAL SPINE: Moderate to severely motion degraded examination. No acute osseous process. Mild canal stenosis C3-4. C5-6 and C6-7 neural foraminal narrowing, likely moderate at C6-7 though limited by motion.   Mr Cervical Spine Wo Contrast Result Date: 12/23/2016 MRI HEAD:  Negative moderately motion degraded MRI of the head. MRI CERVICAL SPINE: Moderate to severely motion degraded examination. No acute osseous process. Mild canal stenosis C3-4. C5-6 and C6-7 neural foraminal narrowing, likely moderate at C6-7 though limited by motion.    Scheduled Meds: . amLODipine  10 mg Oral Daily  . aspirin  325 mg Oral Daily  . clindamycin   600 mg Intravenous Q8H  . cloNIDine  0.3 mg Oral TID  . enoxaparin   30 mg Subcutaneous Q24H  . labetalol  300 mg Oral TID  . minoxidil  2.5 mg Oral Daily  .  multivitamin with mi  1 tablet Oral Daily  . nicotine  21 mg Transdermal Daily   Continuous Infusions:   LOS: 1 day    Time spent: 25 minutes  Greater than 50% of the time spent on counseling and coordinating the care.   Manson Passey, MD [facility] Pager [phone]  If 7PM-7AM, please contact night-coverage [email or web address] Password [facility] 12/24/2016, 10:13 AM

## 2016-12-24 NOTE — Consult Note (Signed)
CARDIOLOGY CONSULT NOTE   Patient ID: Blake Gonzalez MRN: [id number] DOB/AGE: 03-28-1976 41 y.o.  Admit date: 12/22/2016  Requesting Physician: Dr. Charlies Silvers, A. Primary Physician:   Pcp Not In System Primary Cardiologist:   New (Dr. Acie Fredrickson) Reason for Consultation:   Accelerated hypertension, cocaine abuse   HPI: Blake Gonzalez is a 41 y.o. 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  Blake Gonzalez is a 41 y.o. male who is being seen today for the evaluation of hypertensive urgency  at the request of Dr. Charlies Silvers   He was admitted to the hospital on 4/9, after presenting with left arm pain that started two weeks ago. It has developed into associated swelling, numbness and weakness of left hand. No associated trauma, CP, or SOB.  Pt was found to have WBC 3.7, lactate 1.43, slightly worsening renal function, CRP 1.43, ESR 1.0, temperature normal, blood pressure elevation at 215/121.  Pt is admitted to SDU as inpt. IV cardenene gtt was started. [facility], Dr. Oneida Alar was consulted.  CT of chest showed no expansion of type B aortic dissection, no perigraft fluid or gas, ? Enlargement of venous collaterals left shoulder region.  UE venous doppler showed is negative for acute thrombosis, but had a low level echoes seen near a valve at the proximal left IJV with unknown etiology. X-ray of left arm and left wrist are negative for bony abnormalities.  Hospital Course: Given Labetalol 20 mg x 1, clonidine 0.2 mg in ED with only mild improvement, he was started on cardene drip. Home meds of amlodipine 10 mg daily, clonidine 0.3 mg TID, labetalol 300 mg TID. His BP continued to stay elevated and Minoxidil  2.5 mg daily was added to the regimen, cardine infusion discontinued. Positive for cocaine this admission, Elevated Troponin at 0.0 --> 0.06 --> 0.06.  The diagnosis to the left arm swelling is unclear but being managed by medicine and Dr. Oneida Alar.   Blood pressure currently 205/115, therefore cardiology asked to consult       Past Medical History:  Diagnosis Date  . CKD (chronic kidney disease), stage III   . Cocaine abuse    Started using after divorce; no use around time of MI  . Crohn's colitis ([place])    Currently denies  . Hepatitis C    01/17/2012 labs  . HOCM (hypertrophic obstructive cardiomyopathy) ([place])   . Hypertension    Multiple Hospitalizations  . Kidney stones   . Myocardial infarction    In [employer], [place]  . Type 2 dissection of thoracic aorta [facility])      Past Surgical History:  Procedure Laterality Date  . CARDIAC CATHETERIZATION  2011  . CORONARY ANGIOPLASTY WITH STENT PLACEMENT  2011  . LITHOTRIPSY    . OTHER SURGICAL HISTORY  2011   Percutaneous biliary tube  . RENAL BIOPSY  2000  . SMALL INTESTINE SURGERY    . THORACIC AORTIC ANEURYSM REPAIR  12/2015   At [facility]    Allergies  Allergen Reactions  . Vancomycin     Itching Rash MD verbalizes "Red Man Syndrome"  . Hydralazine Hypertension    I have reviewed the patient's current medications . amLODipine  10  mg Oral Daily  . aspirin  325 mg Oral Daily  . clindamycin (CLEOCIN) IV  600 mg Intravenous Q8H  . cloNIDine  0.3 mg Oral TID  . labetalol  300 mg Oral TID  . minoxidil  2.5 mg Oral Daily  . multivitamin with minerals  1 tablet Oral Daily  . nicotine  21 mg Transdermal Daily  . sodium chloride flush  3 mL Intravenous Q12H    acetaminophen, diphenhydrAMINE, ondansetron (ZOFRAN) IV, oxyCODONE-acetaminophen, zolpidem  Prior to Admission medications   Medication Sig Start Date End Date Taking? Authorizing Provider  acetaminophen (TYLENOL) 500 MG tablet Take 500 mg by mouth every 6 (six)  hours as needed (pain).   Yes Historical Provider, MD  amLODipine (NORVASC) 10 MG tablet Take 10 mg by mouth daily.    Yes Historical Provider, MD  aspirin 325 MG tablet Take 325 mg by mouth every morning.    Yes Historical Provider, MD  cloNIDine (CATAPRES) 0.2 MG tablet Take 1 tablet (0.2 mg total) by mouth 3 (three) times daily. Patient taking differently: Take 0.2 mg by mouth at bedtime.  01/27/12 12/22/16 Yes Monika Salk, MD  cloNIDine (CATAPRES) 0.3 MG tablet Take 0.3 mg by mouth 2 (two) times daily.   Yes Historical Provider, MD  labetalol (NORMODYNE) 200 MG tablet Take 200 mg by mouth 3 (three) times daily.    Yes Historical Provider, MD  Multiple Vitamin (MULTIVITAMIN WITH MINERALS) TABS tablet Take 1 tablet by mouth every morning.    Yes Historical Provider, MD  traMADol (ULTRAM) 50 MG tablet Take 1 tablet (50 mg total) by mouth every 12 (twelve) hours as needed. 12/16/16  Yes Jaime Pilcher Ward, PA-C  clindamycin (CLEOCIN) 150 MG capsule Take 2 capsules (300 mg total) by mouth 4 (four) times daily. Patient not taking: Reported on 12/22/2016 12/16/16   [facility] Ward, PA-C  doxycycline (VIBRAMYCIN) 100 MG capsule Take 100 mg by mouth 2 (two) times daily.    Historical Provider, MD  nitroGLYCERIN 0.2 mg/mL infusion Inject 0-200 mcg/min into the vein continuous. 10/11/16   Patrecia Pour, MD     Social History   Social History  . Marital status: Single    Spouse name: N/A  . Number of children: N/A  . Years of education: N/A   Occupational History  . Not on file.   Social History Main Topics  . Smoking status: Current Every Day Smoker    Packs/day: 0.50    Types: Cigarettes  . Smokeless tobacco: Never Used  . Alcohol use No  . Drug use: No     Comment: Hx of Cocaine Abuse; Clean 6-7 months  . Sexual activity: Not on file   Other Topics Concern  . Not on file   Social History Narrative   Divorced   Lives with Girlfriend in [place]   29 yo and 2 yo  Sons          Family Status  Relation Status  . Father   . Maternal Aunt   .     Family History  Problem Relation Age of Onset  . Heart disease Father     early death at age 34  . Diabetes Maternal Aunt   . Cancer          ROS:  Full 14 point review of systems complete and found to be negative unless listed above.  Physical Exam: Blood pressure (!) 205/115, pulse 66, temperature 98  F (36.7 C), temperature source Oral, resp. rate 18, height 6' (1.829 m), weight 147 lb (66.7 kg), SpO2 99 %.  General: Well developed, well nourished, male in no acute distress Head: No xanthomas. Normocephalic and atraumatic, Lungs: normal effort and rate of breathing. Heart:: normal rate and rhythm  No JVD  Neck: No carotid bruits. No lymphadenopathy. Abdomen: Bowel sounds present, abdomen soft and non-tender Msk:  Spontaneously moves all 4 extremities Extremities: No clubbing or cyanosis.  No le edema, swelling to left arm Neuro: Alert and oriented X 3. No focal deficits noted. Psych:  Good affect, responds appropriately Skin: No rashes or lesions noted.     Labs:  Lab Results  Component Value Date   WBC 3.0 (L) 12/23/2016   HGB 11.3 (L) 12/23/2016   HCT 34.5 (L) 12/23/2016   MCV 86.5 12/23/2016   PLT 59 (L) 12/23/2016   No results for input(s): INR in the last 72 hours.  Recent Labs Lab 12/22/16 1802 12/23/16 0815  NA 137 138  K 4.7 4.4  CL 110 110  CO2 21* 21*  BUN 51* 48*  CREATININE 3.96* 3.74*  CALCIUM 8.4* 8.2*  PROT 6.7  --   BILITOT 0.7  --   ALKPHOS 167*  --   ALT 20  --   AST 45*  --   GLUCOSE 104* 95  ALBUMIN 3.3*  --    No results found for: MG  Recent Labs  12/23/16 0210 12/23/16 0815 12/23/16 1345  TROPONINI 0.08* 0.06* 0.06*    Recent Labs  12/23/16 0015  TROPIPOC 0.08   No results found for: DDIMER Lipase  Date/Time Value Ref Range Status  12/03/2016 11:31 PM 31 11 - 51 U/L Final    Echo   Transthoracic  Echocardiography 10/11/2016  Study Conclusions  - Left ventricle: The cavity size was normal. Wall thickness was   increased in a pattern of severe LVH. Systolic function was   mildly to moderately reduced. The estimated ejection fraction was   in the range of 40% to 45%. Diffuse hypokinesis. Doppler   parameters are consistent with abnormal left ventricular   relaxation (grade 1 diastolic dysfunction). Doppler parameters   are consistent with high ventricular filling pressure. - Aortic root: The aortic root was mildly dilated. - Left atrium: The atrium was mildly dilated. - Pericardium, extracardiac: A small pericardial effusion was   identified.  Impressions:  - Mild to moderate global reduction in LV systolic function; grade   1 diastolic dysfunction with elevated LV filling pressure; severe   LVH (consider HCM or infiltrative CM); mild LAE; mildly dilated   aortic root, arch and descending thoracic aorta; cannot R/O   dissection; suggest CTA or MRA to further assess.   ECG:     HR 73, sinus rhythm, probably left atrial enlargement, LVH with IVCD, boderline prolonged QT interval.    Radiology:  Dg Forearm Left Result Date: 12/22/2016  Soft tissue swelling without acute osseous process.   Dg Wrist Complete Left Result Date: 12/22/2016 IMPRESSION: Soft tissue swelling without acute osseous process.   Ct Chest Wo Contrast Result Date: 12/22/2016 1. No acute cardiopulmonary disease. Calcified pulmonary nodules, mediastinal calcified lymph nodes and splenic calcifications are consistent old granulomatous disease. 2. Cardiomegaly with coronary arteriosclerosis and coronary arterial stenting. No significant change in the appearance of the diffuse dilatation of the descending thoracic and upper abdominal aorta compatible with known type B dissection with endovascular stent graft along the descending thoracic aorta.  Ct Cervical Spine Wo Contrast Result Date: 12/22/2016 1. No  evidence of fracture or subluxation along the cervical spine. 2. Mild degenerative change at the mid to lower cervical spine. 3. Large aortic arch and descending thoracic aortic aneurysm is better characterized on concurrent CT of the chest.   Mr Brain Wo Contrast Result Date: 12/23/2016 MRI HEAD:  Negative moderately motion degraded MRI of the head. MRI CERVICAL SPINE: Moderate to severely motion degraded examination. No acute osseous process. Mild canal stenosis C3-4. C5-6 and C6-7 neural foraminal narrowing, likely moderate at C6-7 though limited by motion.   Mr Cervical Spine Wo Contrast Result Date: 12/23/2016 MRI HEAD:  Negative moderately motion degraded MRI of the head. MRI CERVICAL SPINE: Moderate to severely motion degraded examination. No acute osseous process. Mild canal stenosis C3-4. C5-6 and C6-7 neural foraminal narrowing, likely moderate at C6-7 though limited by motion.      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 May increase Minoxidil every 3 days  --Add diuretic, 80 mg Lasix IV BID --Consider using benzo's for cocaine induced hypertension -- Consider hydralazine as part of home regimen 10-110m PO in a few days. --Consider nephrology consult --monitor closely for signs of heart failure or arrhythmia  Mild troponin elevation - Likely due to  accelerated hypertension and cocaine a - No reports of chest pain or SOB - He had recent Echo on 10/11/2016 that showed severe LVH, ejection fraction 40-45% range. Diffuse hypokineses, grade 1 diastolic function.  --in the setting of cocaine use with no CP or SOB I do not recommend any further work-up at this time.  Acute on CKD stage 4 secondary to hypertension - Baseline creatinine appears to be between 2.5 and 3. Over the last few months it has significantly worsened; 3.74 today - Worsening creatinine likely in the setting of cocaine abuse - Continue to monitor renal function daily  Left arm swelling, tenderness, erythema and numbness - IM to manage  History of Type 2 dissection of thoracic aorta - IM to manage  Thrombocytopenia / Anemia of chronic disease/ Leukopenia  - IM to manage  Tobacco abuse and cocaine abuse - Counseled on smoking cessation and drug abuse - Urine drug screen positive for opiates and cocaine  Signed: GLinus Mako PA-C 12/24/2016 10:59 AM  Attending Note:   The patient was seen and examined.  Agree with assessment and plan as noted above.  Changes made to the above note as needed.  Patient seen and independently examined with tDelos Haring PA .   We discussed all aspects of the encounter. I agree with the assessment and plan as stated above.  1. Hypertensive urgency:   Hypertensive heart disease .   Has known HTN for years ,   Has not seen a doctor regualry . Complicated by  aortic dissection a year ago and with hypertensive nephropathy.  Has severe LVH by echo   He is on max dose CCB,  Moderate dose beta blocker.    He was just started on Minoxidil - will increase this to 5 mg  A day . Minoxidil will cause volume retention and should always be used with a diuretic.  He will require high dose diuretics.  Titrate Minoxidil upward every 3 days as needed.  Consider adding hydralazine Consider adding benzodiazepines ( which can be useful in  cocaine induced HTN)  2. CKD stage IV:   Creatinine of 3.74.   I suggest a renal consultation. He has been told that he needs to see nephrology in the past.  3. Hx of arotic dissection:   Related to his severe HTN.   May have been related to cocaine use but he denies it ( he also said that he hasnt been using recently but the past 2 UDS have been positive for cocaine )   4. Cocaine abuse:   This is a real problem for him.  Discussed the consequences of ongoing cocaine use . He may need inpatient rehab somewhere for this .     I have spent a total of 40 minutes with patient reviewing hospital  notes , telemetry, EKGs, labs and examining patient as well as establishing an assessment and plan that was discussed with the patient. > 50% of time was spent in direct patient care.    Thayer Headings, Brooke Bonito., MD, [facility] 12/24/2016, 12:05 PM [address],  [place] Pager [phone]

## 2016-12-24 NOTE — Care Management Note (Signed)
Case Management Note  Patient Details  Name: Blake Gonzalez MRN: [id number] Date of Birth: 06-14-1976  Subjective/Objective:       HTN crisis, cellulitis             Action/Plan: Date:  December 24, 2016 Chart reviewed for concurrent status and case management needs. Will continue to follow patient progress. Discharge Planning: following for needs Expected discharge date: [id number] Marcelle Smiling, BSN, [place], [place]   [phone]  Expected Discharge Date:                  Expected Discharge Plan:  Home/Self Care  In-House Referral:     Discharge planning Services     Post Acute Care Choice:    Choice offered to:     DME Arranged:    DME Agency:     HH Arranged:    HH Agency:     Status of Service:  In process, will continue to follow  If discussed at Long Length of Stay Meetings, dates discussed:    Additional Comments:  Golda Acre, RN 12/24/2016, 10:04 AM

## 2016-12-25 DIAGNOSIS — L03114 Cellulitis of left upper limb: Secondary | ICD-10-CM

## 2016-12-25 LAB — BASIC METABOLIC PANEL
Anion gap: 10 (ref 5–15)
BUN: 42 mg/dL — AB (ref 6–20)
CALCIUM: 8.6 mg/dL — AB (ref 8.9–10.3)
CO2: 21 mmol/L — AB (ref 22–32)
CREATININE: 3.52 mg/dL — AB (ref 0.61–1.24)
Chloride: 106 mmol/L (ref 101–111)
GFR calc non Af Amer: 20 mL/min — ABNORMAL LOW (ref >60)
GFR, EST AFRICAN AMERICAN: 23 mL/min — AB (ref >60)
Glucose, Bld: 100 mg/dL — ABNORMAL HIGH (ref 65–99)
Potassium: 4.4 mmol/L (ref 3.5–5.1)
Sodium: 137 mmol/L (ref 135–145)

## 2016-12-25 LAB — CBC
HEMATOCRIT: 37.4 % — AB (ref 39.0–52.0)
Hemoglobin: 12.4 g/dL — ABNORMAL LOW (ref 13.0–17.0)
MCH: 28.8 pg (ref 26.0–34.0)
MCHC: 33.2 g/dL (ref 30.0–36.0)
MCV: 87 fL (ref 78.0–100.0)
Platelets: 65 10*3/uL — ABNORMAL LOW (ref 150–400)
RBC: 4.3 MIL/uL (ref 4.22–5.81)
RDW: 14.7 % (ref 11.5–15.5)
WBC: 4.2 10*3/uL (ref 4.0–10.5)

## 2016-12-25 LAB — GLUCOSE, CAPILLARY: Glucose-Capillary: 96 mg/dL (ref 65–99)

## 2016-12-25 NOTE — Progress Notes (Signed)
Patient ID: Blake Gonzalez, male   DOB: 09-04-1976, 41 y.o.   MRN: [id number]  PROGRESS NOTE    Blake Gonzalez  QVZ:[id number] DOB: 1975-10-19 DOA: 12/22/2016  PCP: Pcp Not In System   Brief Narrative:  41 y.o.malewith medical history significant for repair of type B dissection at [facility] about 1 year ago,CAD, stent placement, HOCM, HCV, Crohn's disease,cocaine abuse, tobacco abuse, sCHF, CKD-IV, who presented with left arm swelling, pain and numbness. Patient was recently diagnosed with cellulitis on April 2 and was treated with clindamycin. His redness has improved. However, swelling persisted. Of note, pt was initially started with IV vancomycin in ED, but he developed red man syndrome, then vancomycin was discontinued. Patient underwent upper extremity venous Doppler which was negative for DVT. However, revealed low level echoes seen near a valve at the proximal left IJV with unknownetiology. Patient also noted to have malignant hypertension. He was subsequently hospitalized for further management.  Assessment & Plan:   Accelerated hypertension - Likely in the setting of cocaine abuse - Patient was treated with labetalol 20 mg 1, clonidine 0.2 mg in ED, with only mild improvement - He was briefly started on a Cardene infusion - Cardio consulted, appreciate their input - pt currently on Norvasc, clonidine, lasix, labetalol and minoxidil. Per cardio, minoxidil causes volume retention so pt needs to be on diuretic so lasix added - BP 151/97  Left arm cellulitis  / Leukopenia  - Recently diagnosed with cellulitis and treated with clindamycin - Continue IV clinda as cellulitis and pain better this am - Upper extremity Doppler study showed nonspecific findings - Patient seen by vascular surgery - MRA chest was recommended to evaluate the graft and vasculature (would not be a good study due to pt previous history of surgery). MRI brain and MRI cervical spine also ordered to rule out any  intracranial or cervical spine lesion as a reason for his numbness. No findings to explain ongoing numbness in his hand  Mild troponin elevation / Chronic diastolic and systolic CHF - Likely due to accelerated hypertension and cocaine a - No reports of chest pain - ECHO in 09/2016 showed EF 40-45%, diffuse hypokinesis and grade 1 DD  Acute on CKD stage 4 secondary to hypertension - Baseline creatinine appears to be between 2.5 and 3. Over the last few months it has significantly worsened. - Worsening creatinine likely in the setting of cocaine abuse - Continue to monitor renal function daily - Cr 3.74 --> 3.52   History of Type 2 dissection of thoracic aorta - S/p repair 12/[facility] at [facility]. Stable on CT-chest  Thrombocytopenia / Anemia of chronic disease/ Leukopenia  - Secondary to bone marrow suppression from history of HCV - Stopped Lovenox subcutaneous 4/11 and using SCDs for DVT prophylaxis - WBC count WNL this am  - Hgb better this am, 12.4 - Platelets improving, 65 today   Tobacco abuse and cocaine abuse - Counseled on smoking cessation and drug abuse - Urine drug screen positive for opiates and cocaine   DVT prophylaxis: Lovenox subcutaneous, we'll stop it because of thrombocytopenia, use SCDs Code Status: full code  Family Communication: no family at the bedside Disposition Plan: home likely Saturday    Consultants:   Vascular surgery, Dr. Fabienne Bruns  Cardiology   Procedures:   Left upper extremity venous Doppler Preliminary findings: The left upper extremity is negative for acute thrombosis in the visualized veins. Low level echoes seen near a valve at the proximal left IJV -  unknown etiology.  Antimicrobials:   Clindamycin 12/22/2016 -->   Subjective: Patient says his pain is better controlled.   Objective: Vitals:   12/24/16 0950 12/24/16 1441 12/24/16 2133 12/25/16 0521  BP: (!) 205/115 (!) 132/114 (!) 164/102 (!) 151/97  Pulse: 66 66 61 63   Resp:  Temp:  97.9 F (36.6 C) 98.4 F (36.9 C) 97.7 F (36.5 C)  TempSrc:  Oral Oral Axillary  SpO2:  97% 99% 100%  Weight:      Height:        Intake/Output Summary (Last 24 hours) at 12/25/16 0908 Last data filed at 12/25/16 0800  Gross per 24 hour  Intake             1226 ml  Output             1600 ml  Net             -374 ml   Filed Weights   12/22/16 1516  Weight: 66.7 kg (147 lb)    Examination:  General exam: Appears calm and comfortable, no distress  Respiratory system: No wheezing, no rhonchi  Cardiovascular system: S1 & S2 heard, Rate controlled  Gastrointestinal system: (+) BS, non tender abdomen  Central nervous system: No focal neurological deficits. Extremities: left arm redness little better this am  Skin: warm, dry Psychiatry: Normal mood and behavior   Data Reviewed: I have personally reviewed following labs and imaging studies  CBC:  Recent Labs Lab 12/22/16 1802 12/23/16 0815 12/25/16 0651  WBC 3.7* 3.0* 4.2  NEUTROABS 2.4  --   --   HGB 11.9* 11.3* 12.4*  HCT 36.4* 34.5* 37.4*  MCV 88.3 86.5 87.0  PLT 61* 59* 65*   Basic Metabolic Panel:  Recent Labs Lab 12/22/16 1802 12/23/16 0815 12/25/16 0651  NA 137 138 137  K 4.7 4.4 4.4  CL 110 110 106  CO2 21* 21* 21*  GLUCOSE 104* 95 100*  BUN 51* 48* 42*  CREATININE 3.96* 3.74* 3.52*  CALCIUM 8.4* 8.2* 8.6*   GFR: Estimated Creatinine Clearance: 25.8 mL/min (A) (by C-G formula based on SCr of 3.52 mg/dL (H)). Liver Function Tests:  Recent Labs Lab 12/22/16 1802  AST 45*  ALT 20  ALKPHOS 167*  BILITOT 0.7  PROT 6.7  ALBUMIN 3.3*   No results for input(s): LIPASE, AMYLASE in the last 168 hours. No results for input(s): AMMONIA in the last 168 hours. Coagulation Profile: No results for input(s): INR, PROTIME in the last 168 hours. Cardiac Enzymes:  Recent Labs Lab 12/23/16 0210 12/23/16 0815 12/23/16 1345  TROPONINI 0.08* 0.06* 0.06*   BNP (last 3  results) No results for input(s): PROBNP in the last 8760 hours. HbA1C: No results for input(s): HGBA1C in the last 72 hours. CBG:  Recent Labs Lab 12/23/16 0725 12/24/16 0725 12/25/16 0740  GLUCAP 101* 87 96   Lipid Profile: No results for input(s): CHOL, HDL, LDLCALC, TRIG, CHOLHDL, LDLDIRECT in the last 72 hours. Thyroid Function Tests: No results for input(s): TSH, T4TOTAL, FREET4, T3FREE, THYROIDAB in the last 72 hours. Anemia Panel: No results for input(s): VITAMINB12, FOLATE, FERRITIN, TIBC, IRON, RETICCTPCT in the last 72 hours. Urine analysis:    Component Value Date/Time   COLORURINE YELLOW 12/03/2016 2253   APPEARANCEUR CLEAR 12/03/2016 2253   LABSPEC 1.016 12/03/2016 2253   PHURINE 5.0 12/03/2016 2253   GLUCOSEU NEGATIVE 12/03/2016 2253   HGBUR MODERATE (A) 12/03/2016 2253   BILIRUBINUR NEGATIVE  12/03/2016 2253   KETONESUR NEGATIVE 12/03/2016 2253   PROTEINUR >=300 (A) 12/03/2016 2253   UROBILINOGEN 0.2 01/30/2012 0204   NITRITE NEGATIVE 12/03/2016 2253   LEUKOCYTESUR NEGATIVE 12/03/2016 2253   Sepsis Labs: (procalcitonin:4,lacticidven:4)   Recent Results (from the past 240 hour(s))  Culture, blood (routine x 2)     Status: None (Preliminary result)   Collection Time: 12/22/16  6:25 PM  Result Value Ref Range Status   Specimen Description BLOOD RIGHT HAND  Final   Special Requests   Final    BOTTLES DRAWN AEROBIC AND ANAEROBIC Blood Culture adequate volume   Culture   Final    NO GROWTH 1 DAY Performed at [facility], [address] [place], [place] [address]    Report Status PENDING  Incomplete  Culture, blood (routine x 2)     Status: None (Preliminary result)   Collection Time: 12/23/16  2:12 AM  Result Value Ref Range Status   Specimen Description BLOOD RIGHT HAND  Final   Special Requests   Final    BOTTLES DRAWN AEROBIC AND ANAEROBIC Blood Culture adequate volume   Culture   Final    NO GROWTH 1 DAY Performed at [facility], [address], [place], [place] [address]    Report Status PENDING  Incomplete      Radiology Studies: Dg Forearm Left Result Date: 12/22/2016 Soft tissue swelling without acute osseous process.   Dg Wrist Complete Left Result Date: 12/22/2016 Soft tissue swelling without acute osseous process.   Ct Chest Wo Contrast Result Date: 12/22/2016 1. No acute cardiopulmonary disease. Calcified pulmonary nodules, mediastinal calcified lymph nodes and splenic calcifications are consistent old granulomatous disease. 2. Cardiomegaly with coronary arteriosclerosis and coronary arterial stenting. No significant change in the appearance of the diffuse dilatation of the descending thoracic and upper abdominal aorta compatible with known type B dissection with endovascular stent graft along the descending thoracic aorta.   Ct Cervical Spine Wo Contrast Result Date: 12/22/2016 1. No evidence of fracture or subluxation along the cervical spine. 2. Mild degenerative change at the mid to lower cervical spine. 3. Large aortic arch and descending thoracic aortic aneurysm is better characterized on concurrent CT of the chest.  Mr Brain Wo Contrast Result Date: 12/23/2016 MRI HEAD:  Negative moderately motion degraded MRI of the head. MRI CERVICAL SPINE: Moderate to severely motion degraded examination. No acute osseous process. Mild canal stenosis C3-4. C5-6 and C6-7 neural foraminal narrowing, likely moderate at C6-7 though limited by motion.   Mr Cervical Spine Wo Contrast Result Date: 12/23/2016 MRI HEAD:  Negative moderately motion degraded MRI of the head. MRI CERVICAL SPINE: Moderate to severely motion degraded examination. No acute osseous process. Mild canal stenosis C3-4. C5-6 and C6-7 neural foraminal narrowing, likely moderate at C6-7 though limited by motion.    Marland Kitchen amLODipine  10 mg Oral Daily  . aspirin  325 mg Oral Daily  . clindamycin (CLEOCIN) IV  600 mg Intravenous Q8H  . cloNIDine  0.3  mg Oral TID  . furosemide  80 mg Intravenous BID  . labetalol  300 mg Oral TID  . minoxidil  2.5 mg Oral Daily  . multivitamin with minerals  1 tablet Oral Daily  . nicotine  21 mg Transdermal Daily    Continuous Infusions:   LOS: 2 days    Time spent: 25 minutes  Greater than 50% of the time spent on counseling and coordinating the care.   Satina Jerrell  Elisabeth Pigeon, MD [facility] Pager [phone]  If 7PM-7AM, please contact night-coverage [email or web address] Password [email or web address] 12/25/2016, 9:08 AM

## 2016-12-25 NOTE — Progress Notes (Signed)
Patient called this nurse into the room.  Stated he had to leave right then.  Tried to talk him into staying longer.  Patient refused.  MD notified.   IV removed.  Patient signed AMA paperwork.  Patient escorted out of the building.

## 2016-12-28 LAB — CULTURE, BLOOD (ROUTINE X 2)
Culture: NO GROWTH
Culture: NO GROWTH
Special Requests: ADEQUATE
Special Requests: ADEQUATE

## 2017-01-08 NOTE — Discharge Summary (Signed)
Physician Discharge Summary  Blake Gonzalez:[id number] DOB: [birth year] DOA: 12/22/2016  PCP: Pcp Not In System  Admit date: 12/22/2016 Discharge date: 01/08/2017  Recommendations for Outpatient Follow-up:  1. 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 Patient was recently diagnosed with cellulitis on April 2 and was treated with clindamycin. His redness has improved. However, swelling persisted. Of note, pt was initially started with IV vancomycin in ED, but he developed red man syndrome, then vancomycin was discontinued.Patient underwent upper extremity venous Doppler which was negative for DVT. However, revealed low level echoes seen near a valve at the proximal left IJV with unknownetiology. Patient also noted to have malignant hypertension. He was subsequently hospitalized for further management.  Hospital Course:   Accelerated hypertension - Likely in the setting of cocaine abuse - Patient was treated with labetalol 20 mg 1, clonidine 0.2 mg in ED, with only mild improvement - He was briefly started on a Cardene infusion - Cardio consulted, appreciate their input - pt currently on Norvasc, clonidine, lasix, labetalol and minoxidil. Per cardio, minoxidil causes volume retention so pt needs to be on diuretic so lasix added - Pt left AMA so Home  medications resumed  Left arm cellulitis  / Leukopenia  - Recently diagnosed with cellulitis and treated with clindamycin - Upper extremity Doppler study showed nonspecific findings - Patient seen by vascular surgery - MRA chest was recommended to evaluate the graft and vasculature (would not be a good study due to pt previous history of surgery). MRI brain and MRI cervical spine also ordered to rule out any intracranial or cervical spine lesion as a reason for his numbness. No findings to explain ongoing numbness in his hand - Patient will continue clindamycin, he left AGAINST MEDICAL ADVICE or call the pharmacy to fill the medication  Mild troponin elevation / Chronic diastolic and systolic CHF - Likely due to accelerated hypertension and cocaine a - No reports of chest pain - ECHO in 09/2016 showed EF 40-45%, diffuse hypokinesis and grade 1 DD  Acute on CKD stage 4 secondary to hypertension - Baseline creatinine appears to be between 2.5 and 3. Over the last few months it has significantly worsened. - Worsening creatinine likely in the setting of cocaine abuse - Continue to monitor renal function daily - Cr 3.74 --> 3.52   History of Type 2 dissection of thoracic aorta - S/p repair 12/2015 at [facility]. Stable on CT-chest  Thrombocytopenia / Anemia of chronic disease/ Leukopenia  - Secondary to bone marrow suppression from history of HCV - Stopped Lovenox subcutaneous 4/11 and using SCDs for DVT prophylaxis - WBC count WNL this am  - Hgb better this am, 12.4 - Platelets improving, 65 today   Tobacco abuse and cocaine abuse - Counseled on smoking cessation and drug abuse - Urine drug screen positive for opiates and cocaine   DVT prophylaxis: Lovenox  subcutaneous, we'll stop it because of thrombocytopenia, use SCDs Code Status: full code  Family Communication: no family at the bedside Disposition Plan: home likely Saturday    Consultants:   Vascular surgery,  Dr. Fabienne Bruns  Cardiology   Procedures:   Left upper extremity venous Doppler Preliminary findings: The left upper extremity is negative for acute thrombosis in the visualized veins. Low level echoes seen near a valve at the proximal left IJV - unknown etiology.  Antimicrobials:   Clindamycin 12/22/2016 -->  Signed:  Manson Passey, MD  [facility] 01/08/2017, 9:19 AM  Pager #: [phone]     Discharge Exam: Vitals:   12/25/16 1131 12/25/16 1419  BP: (!) 144/95 131/84  Pulse: 70 62  Resp:  16  Temp:  98.3 F (36.8 C)   Vitals:   12/24/16 2133 12/25/16 0521 12/25/16 1131 12/25/16 1419  BP: (!) 164/102 (!) 151/97 (!) 144/95 131/84  Pulse: 61 63 70 62  Resp: Temp: 98.4 F (36.9 C) 97.7 F (36.5 C)  98.3 F (36.8 C)  TempSrc: Oral Axillary  Oral  SpO2: 99% 100%  100%  Weight:      Height:       Pt examined earlier ina day and then left AMA  Discharge Instructions   Allergies as of 12/25/2016      Reactions   Vancomycin    Itching Rash MD verbalizes "Red Man Syndrome"   Hydralazine Hypertension      Medication List    ASK your doctor about these medications   acetaminophen 500 MG tablet Commonly known as:  TYLENOL Take 500 mg by mouth every 6 (six) hours as needed (pain).   amLODipine 10 MG tablet Commonly known as:  NORVASC Take 10 mg by mouth daily.   aspirin 325 MG tablet Take 325 mg by mouth every morning.   clindamycin 150 MG capsule Commonly known as:  CLEOCIN Take 2 capsules (300 mg total) by mouth 4 (four) times daily.   cloNIDine 0.3 MG tablet Commonly known as:  CATAPRES Take 0.3 mg by mouth 2 (two) times daily.   cloNIDine 0.2 MG tablet Commonly known as:  CATAPRES Take 1 tablet (0.2 mg total) by mouth 3 (three) times daily.   doxycycline 100 MG capsule Commonly known as:  VIBRAMYCIN Take 100 mg by mouth 2 (two) times daily.   labetalol 200 MG tablet Commonly known as:  NORMODYNE Take 200 mg by  mouth 3 (three) times daily.   multivitamin with minerals Tabs tablet Take 1 tablet by mouth every morning.   nitroGLYCERIN 0.2 mg/mL infusion Inject 0-200 mcg/min into the vein continuous.   traMADol 50 MG tablet Commonly known as:  ULTRAM Take 1 tablet (50 mg total) by mouth every 12 (twelve) hours as needed.         The results of significant diagnostics from this hospitalization (including imaging, microbiology, ancillary and laboratory) are listed below for reference.    Significant Diagnostic Studies: Dg Chest 2 View  Result Date: 12/15/2016 CLINICAL DATA:  24 hour history of upper left chest pain radiating into upper extremity. EXAM: CHEST  2 VIEW COMPARISON:  12/04/2016 FINDINGS: Scattered calcified granulomata are again evident. The lungs are otherwise clear. The pulmonary vasculature is normal. Prior endoluminal aortic stent graft. Stable aortic contours. IMPRESSION: Calcified granulomatous changes.  No acute cardiopulmonary findings. Electronically Signed   By: Ellery Plunk M.D.   On: 12/15/2016 23:14   Dg Forearm Left  Result Date:  12/22/2016 CLINICAL DATA:  Worsening LEFT arm cellulitis, completed antibiotic course. EXAM: LEFT WRIST - COMPLETE 3+ VIEW; LEFT FOREARM - 2 VIEW COMPARISON:  LEFT wrist radiographs December 15, 2016 FINDINGS: No acute fracture deformity or dislocation. Joint spaces intact without erosions. No destructive bony lesions. Dorsal wrist and hand soft tissue swelling without subcutaneous gas or radiopaque foreign bodies. IMPRESSION: Soft tissue swelling without acute osseous process. Electronically Signed   By: Awilda Metro M.D.   On: 12/22/2016 17:23   Dg Wrist Complete Left  Result Date: 12/22/2016 CLINICAL DATA:  Worsening LEFT arm cellulitis, completed antibiotic course. EXAM: LEFT WRIST - COMPLETE 3+ VIEW; LEFT FOREARM - 2 VIEW COMPARISON:  LEFT wrist radiographs December 15, 2016 FINDINGS: No acute fracture deformity or dislocation. Joint spaces  intact without erosions. No destructive bony lesions. Dorsal wrist and hand soft tissue swelling without subcutaneous gas or radiopaque foreign bodies. IMPRESSION: Soft tissue swelling without acute osseous process. Electronically Signed   By: Awilda Metro M.D.   On: 12/22/2016 17:23   Dg Wrist Complete Left  Result Date: 12/15/2016 CLINICAL DATA:  Upper extremity pain and swelling for 4 days. No trauma. EXAM: LEFT WRIST - COMPLETE 3+ VIEW COMPARISON:  None. FINDINGS: There is no evidence of fracture or dislocation. There is no evidence of arthropathy or other focal bone abnormality. Soft tissues are unremarkable. IMPRESSION: Negative. Electronically Signed   By: Ellery Plunk M.D.   On: 12/15/2016 23:15   Ct Chest Wo Contrast  Result Date: 12/22/2016 CLINICAL DATA:  Left arm pain with numbness. History of aortic dissection and aneurysm repair. EXAM: CT CHEST WITHOUT CONTRAST TECHNIQUE: Multidetector CT imaging of the chest was performed following the standard protocol without IV contrast. COMPARISON:  10/11/2016 CT, 12/15/2016 CXR FINDINGS: Cardiovascular: Stable cardiomegaly without significant pericardial effusion or thickening. Coronary arteriosclerosis is again visualized with coronary artery stent. Chronic stable 4.1 cm ascending aortic aneurysm unchanged in appearance. An aortic endovascular graft is seen starting in the proximal aortic arch at intake of the left subclavian artery and along the descending thoracic aorta into the level the diaphragmatic hiatus where there is chronic stable tortuous appearance artery aorta noted. Chronic stable mild dilatation of the main pulmonary artery to 3.6 cm. Further evaluation of aorta and pulmonary vessels is limited on this noncontrast study. No evidence aneurysm leak or intramural hematoma. Partially calcified mural thrombus is seen along the distal aortic arch as before. Suprarenal aneurysmal dilatation of the aorta is seen at the diaphragmatic hiatus  measuring up to 5.7 cm versus 5.4 cm previously, some of this is due to slight operator dependent imaging differences. Mediastinum/Nodes: Multiple small calcified mediastinal lymph nodes are noted consistent with old granulomatous disease. The esophagus and thyroid are grossly unremarkable. No hilar nor mediastinal adenopathy. Lungs/Pleura: Bilateral scattered pulmonary nodules are in keeping with old granulomatous disease and stable in appearance. No focal pulmonary consolidation, effusion or pneumothorax. Upper Abdomen: Splenic granulomas are also present with stable splenomegaly. Mild atrophy of the kidneys with perinephric fat stranding. Nonobstructing calculi noted of the left kidney with faint nephrocalcinosis suggested. Musculoskeletal: No chest wall mass or suspicious bone lesions identified. No axillary mass or adenopathy to account for the left shoulder pain and numbness. IMPRESSION: 1. No acute cardiopulmonary disease. Calcified pulmonary nodules, mediastinal calcified lymph nodes and splenic calcifications are consistent old granulomatous disease. 2. Cardiomegaly with coronary arteriosclerosis and coronary arterial stenting. No significant change in the appearance of the diffuse dilatation of the descending thoracic and upper abdominal aorta  compatible with known type B dissection with endovascular stent graft along the descending thoracic aorta. Electronically Signed   By: Tollie Eth M.D.   On: 12/22/2016 21:42   Ct Cervical Spine Wo Contrast  Result Date: 12/22/2016 CLINICAL DATA:  Acute onset of left arm pain and numbness, with erythema. Initial encounter. EXAM: CT CERVICAL SPINE WITHOUT CONTRAST TECHNIQUE: Multidetector CT imaging of the cervical spine was performed without intravenous contrast. Multiplanar CT image reconstructions were also generated. COMPARISON:  None. FINDINGS: Alignment: Normal. Skull base and vertebrae: No acute fracture. No primary bone lesion or focal pathologic process.  Soft tissues and spinal canal: No prevertebral fluid or swelling. No visible canal hematoma. Disc levels: Multilevel disc space narrowing is noted at the mid to lower cervical spine, with minimal vacuum phenomenon noted at C6-C7. Scattered anterior posterior disc osteophyte complexes are noted at the lower cervical spine. Upper chest: The patient's large aortic arch and descending thoracic aorta aneurysm is better characterized on concurrent CT of the chest. A calcified granuloma is noted at the left lung apex. The thyroid gland is unremarkable in appearance. Other: The minimally visualized portions of the brain are unremarkable. IMPRESSION: 1. No evidence of fracture or subluxation along the cervical spine. 2. Mild degenerative change at the mid to lower cervical spine. 3. Large aortic arch and descending thoracic aortic aneurysm is better characterized on concurrent CT of the chest. Electronically Signed   By: Roanna Raider M.D.   On: 12/22/2016 21:29   Mr Brain Wo Contrast  Result Date: 12/23/2016 CLINICAL DATA:  LEFT arm pain, swelling and numbness. Diagnosed and treated for cellulitis April 2nd. History of Crohn's disease, type B aortic dissection, substance abuse, chronic kidney disease. EXAM: MRI HEAD WITHOUT CONTRAST MRI CERVICAL SPINE WITHOUT CONTRAST TECHNIQUE: Multiplanar, multiecho pulse sequences of the brain and surrounding structures, and cervical spine, to include the craniocervical junction and cervicothoracic junction, were obtained without intravenous contrast. COMPARISON:  CT cervical spine November 21, 2016 and CT HEAD September 06, 2016 FINDINGS: MRI HEAD FINDINGS- all sequences are moderately motion degraded. BRAIN: No reduced diffusion to suggest acute ischemia. No susceptibility artifact to suggest hemorrhage. The ventricles and sulci are normal for patient's age. No suspicious parenchymal signal, masses or mass effect. No abnormal extra-axial fluid collections. VASCULAR: Normal major  intracranial vascular flow voids present at skull base. SKULL AND UPPER CERVICAL SPINE: No abnormal sellar expansion. No suspicious calvarial bone marrow signal. Craniocervical junction maintained. SINUSES/ORBITS: Trace paranasal sinus mucosal thickening. Mastoid air cells are well aerated. The included ocular globes and orbital contents are non-suspicious. OTHER: Patient is edentulous. MRI CERVICAL SPINE FINDINGS- sequences are moderately or severely motion degraded. ALIGNMENT: Straightened cervical lordosis.  No malalignment. VERTEBRAE/DISCS: Vertebral bodies are intact. Severe C5-6 disc height loss similar to prior imaging, moderate at C6-7 with moderate chronic discogenic endplate changes C4-5 through C6-7. No acute osseous process. CORD:Cervical spinal cord is normal morphology and signal characteristics from the cervicomedullary junction to level of T1-2, the most caudal well visualized level though motion degrades sensitivity for potential subtle cord signal abnormality. POSTERIOR FOSSA, VERTEBRAL ARTERIES, PARASPINAL TISSUES: No MR findings of ligamentous injury. Vertebral artery flow voids present. Included posterior fossa and paraspinal soft tissues are normal. DISC LEVELS (evaluation limited by motion degraded axial sequences): C2-3: No disc bulge, canal stenosis nor neural foraminal narrowing. C3-4: Small broad-based disc bulge, uncovertebral hypertrophy. Mild canal stenosis. No neural foraminal narrowing. C4-5: Uncovertebral hypertrophy. No canal stenosis. No neural foraminal narrowing. C5-6: Uncovertebral hypertrophy. No  canal stenosis. Mild to moderate neural foraminal narrowing. C6-7: Uncovertebral hypertrophy. No canal stenosis. Moderate neural foraminal narrowing better seen on prior CT. C7-T1: No disc bulge, canal stenosis nor neural foraminal narrowing. IMPRESSION: MRI HEAD:  Negative moderately motion degraded MRI of the head. MRI CERVICAL SPINE: Moderate to severely motion degraded examination.  No acute osseous process. Mild canal stenosis C3-4. C5-6 and C6-7 neural foraminal narrowing, likely moderate at C6-7 though limited by motion. Electronically Signed   By: Awilda Metro M.D.   On: 12/23/2016 21:37   Mr Cervical Spine Wo Contrast  Result Date: 12/23/2016 CLINICAL DATA:  LEFT arm pain, swelling and numbness. Diagnosed and treated for cellulitis April 2nd. History of Crohn's disease, type B aortic dissection, substance abuse, chronic kidney disease. EXAM: MRI HEAD WITHOUT CONTRAST MRI CERVICAL SPINE WITHOUT CONTRAST TECHNIQUE: Multiplanar, multiecho pulse sequences of the brain and surrounding structures, and cervical spine, to include the craniocervical junction and cervicothoracic junction, were obtained without intravenous contrast. COMPARISON:  CT cervical spine November 21, 2016 and CT HEAD September 06, 2016 FINDINGS: MRI HEAD FINDINGS- all sequences are moderately motion degraded. BRAIN: No reduced diffusion to suggest acute ischemia. No susceptibility artifact to suggest hemorrhage. The ventricles and sulci are normal for patient's age. No suspicious parenchymal signal, masses or mass effect. No abnormal extra-axial fluid collections. VASCULAR: Normal major intracranial vascular flow voids present at skull base. SKULL AND UPPER CERVICAL SPINE: No abnormal sellar expansion. No suspicious calvarial bone marrow signal. Craniocervical junction maintained. SINUSES/ORBITS: Trace paranasal sinus mucosal thickening. Mastoid air cells are well aerated. The included ocular globes and orbital contents are non-suspicious. OTHER: Patient is edentulous. MRI CERVICAL SPINE FINDINGS- sequences are moderately or severely motion degraded. ALIGNMENT: Straightened cervical lordosis.  No malalignment. VERTEBRAE/DISCS: Vertebral bodies are intact. Severe C5-6 disc height loss similar to prior imaging, moderate at C6-7 with moderate chronic discogenic endplate changes C4-5 through C6-7. No acute osseous process.  CORD:Cervical spinal cord is normal morphology and signal characteristics from the cervicomedullary junction to level of T1-2, the most caudal well visualized level though motion degrades sensitivity for potential subtle cord signal abnormality. POSTERIOR FOSSA, VERTEBRAL ARTERIES, PARASPINAL TISSUES: No MR findings of ligamentous injury. Vertebral artery flow voids present. Included posterior fossa and paraspinal soft tissues are normal. DISC LEVELS (evaluation limited by motion degraded axial sequences): C2-3: No disc bulge, canal stenosis nor neural foraminal narrowing. C3-4: Small broad-based disc bulge, uncovertebral hypertrophy. Mild canal stenosis. No neural foraminal narrowing. C4-5: Uncovertebral hypertrophy. No canal stenosis. No neural foraminal narrowing. C5-6: Uncovertebral hypertrophy. No canal stenosis. Mild to moderate neural foraminal narrowing. C6-7: Uncovertebral hypertrophy. No canal stenosis. Moderate neural foraminal narrowing better seen on prior CT. C7-T1: No disc bulge, canal stenosis nor neural foraminal narrowing. IMPRESSION: MRI HEAD:  Negative moderately motion degraded MRI of the head. MRI CERVICAL SPINE: Moderate to severely motion degraded examination. No acute osseous process. Mild canal stenosis C3-4. C5-6 and C6-7 neural foraminal narrowing, likely moderate at C6-7 though limited by motion. Electronically Signed   By: Awilda Metro M.D.   On: 12/23/2016 21:37   Dg Shoulder Left  Result Date: 12/15/2016 CLINICAL DATA:  Left upper extremity pain and swelling for 4 days, without trauma. EXAM: LEFT SHOULDER - 2+ VIEW COMPARISON:  None. FINDINGS: There is no evidence of fracture or dislocation. There is no evidence of arthropathy or other focal bone abnormality. Soft tissues are unremarkable. IMPRESSION: Negative. Electronically Signed   By: Ellery Plunk M.D.   On: 12/15/2016 23:15  Microbiology: No results found for this or any previous visit (from the past 240  hour(s)).   Labs: Basic Metabolic Panel: No results for input(s): NA, K, CL, CO2, GLUCOSE, BUN, CREATININE, CALCIUM, MG, PHOS in the last 168 hours. Liver Function Tests: No results for input(s): AST, ALT, ALKPHOS, BILITOT, PROT, ALBUMIN in the last 168 hours. No results for input(s): LIPASE, AMYLASE in the last 168 hours. No results for input(s): AMMONIA in the last 168 hours. CBC: No results for input(s): WBC, NEUTROABS, HGB, HCT, MCV, PLT in the last 168 hours. Cardiac Enzymes: No results for input(s): CKTOTAL, CKMB, CKMBINDEX, TROPONINI in the last 168 hours. BNP: BNP (last 3 results) No results for input(s): BNP in the last 8760 hours.  ProBNP (last 3 results) No results for input(s): PROBNP in the last 8760 hours.  CBG: No results for input(s): GLUCAP in the last 168 hours.

## 2017-02-10 ENCOUNTER — Emergency Department (HOSPITAL_COMMUNITY)
Admission: EM | Admit: 2017-02-10 | Discharge: 2017-02-10 | Disposition: A | Discharge status: Home / Self Care | Payer: 59 | Attending: Emergency Medicine | Admitting: Emergency Medicine

## 2017-02-10 ENCOUNTER — Emergency Department (HOSPITAL_COMMUNITY): Payer: 59

## 2017-02-10 ENCOUNTER — Encounter (HOSPITAL_COMMUNITY): Payer: Self-pay | Admitting: Family Medicine

## 2017-02-10 DIAGNOSIS — R0789 Other chest pain: Secondary | ICD-10-CM | POA: Diagnosis not present

## 2017-02-10 DIAGNOSIS — Z7982 Long term (current) use of aspirin: Secondary | ICD-10-CM | POA: Insufficient documentation

## 2017-02-10 DIAGNOSIS — N184 Chronic kidney disease, stage 4 (severe): Secondary | ICD-10-CM | POA: Diagnosis not present

## 2017-02-10 DIAGNOSIS — I252 Old myocardial infarction: Secondary | ICD-10-CM | POA: Diagnosis not present

## 2017-02-10 DIAGNOSIS — F17210 Nicotine dependence, cigarettes, uncomplicated: Secondary | ICD-10-CM | POA: Diagnosis not present

## 2017-02-10 DIAGNOSIS — I2510 Atherosclerotic heart disease of native coronary artery without angina pectoris: Secondary | ICD-10-CM | POA: Insufficient documentation

## 2017-02-10 DIAGNOSIS — I129 Hypertensive chronic kidney disease with stage 1 through stage 4 chronic kidney disease, or unspecified chronic kidney disease: Secondary | ICD-10-CM | POA: Diagnosis not present

## 2017-02-10 DIAGNOSIS — Z79899 Other long term (current) drug therapy: Secondary | ICD-10-CM | POA: Diagnosis not present

## 2017-02-10 DIAGNOSIS — R072 Precordial pain: Secondary | ICD-10-CM | POA: Diagnosis present

## 2017-02-10 LAB — COMPREHENSIVE METABOLIC PANEL
ALBUMIN: 3.5 g/dL (ref 3.5–5.0)
ALT: 13 U/L — ABNORMAL LOW (ref 17–63)
ANION GAP: 8 (ref 5–15)
AST: 27 U/L (ref 15–41)
Alkaline Phosphatase: 152 U/L — ABNORMAL HIGH (ref 38–126)
BUN: 34 mg/dL — AB (ref 6–20)
CHLORIDE: 106 mmol/L (ref 101–111)
CO2: 22 mmol/L (ref 22–32)
Calcium: 8.7 mg/dL — ABNORMAL LOW (ref 8.9–10.3)
Creatinine, Ser: 4.11 mg/dL — ABNORMAL HIGH (ref 0.61–1.24)
GFR calc Af Amer: 19 mL/min — ABNORMAL LOW (ref >60)
GFR, EST NON AFRICAN AMERICAN: 17 mL/min — AB (ref >60)
Glucose, Bld: 102 mg/dL — ABNORMAL HIGH (ref 65–99)
POTASSIUM: 4.4 mmol/L (ref 3.5–5.1)
Sodium: 136 mmol/L (ref 135–145)
Total Bilirubin: 0.4 mg/dL (ref 0.3–1.2)
Total Protein: 7.1 g/dL (ref 6.5–8.1)

## 2017-02-10 LAB — CK TOTAL AND CKMB (NOT AT ARMC)
CK, MB: 3.5 ng/mL (ref 0.5–5.0)
RELATIVE INDEX: 3.5 — AB (ref 0.0–2.5)
Total CK: 101 U/L (ref 49–397)

## 2017-02-10 LAB — POCT I-STAT TROPONIN I
TROPONIN I, POC: 0.04 ng/mL (ref 0.00–0.08)
Troponin i, poc: 0.04 ng/mL (ref 0.00–0.08)

## 2017-02-10 LAB — CBC
HEMATOCRIT: 37.6 % — AB (ref 39.0–52.0)
HEMOGLOBIN: 12 g/dL — AB (ref 13.0–17.0)
MCH: 28.3 pg (ref 26.0–34.0)
MCHC: 31.9 g/dL (ref 30.0–36.0)
MCV: 88.7 fL (ref 78.0–100.0)
Platelets: 69 10*3/uL — ABNORMAL LOW (ref 150–400)
RBC: 4.24 MIL/uL (ref 4.22–5.81)
RDW: 15.5 % (ref 11.5–15.5)
WBC: 4.6 10*3/uL (ref 4.0–10.5)

## 2017-02-10 LAB — LIPASE, BLOOD: LIPASE: 22 U/L (ref 11–51)

## 2017-02-10 MED ORDER — CLONIDINE HCL 0.1 MG PO TABS
0.3000 mg | ORAL_TABLET | Freq: Once | ORAL | Status: AC
  Administered 2017-02-10: 0.3 mg via ORAL
  Filled 2017-02-10: qty 3

## 2017-02-10 MED ORDER — TRAMADOL HCL 50 MG PO TABS: 50.0000 mg | ORAL_TABLET | Freq: Two times a day (BID) | ORAL | 0 refills | Status: AC | PRN

## 2017-02-10 NOTE — ED Notes (Signed)
Pt. Made aware for the need of urine specimen. 

## 2017-02-10 NOTE — ED Notes (Signed)
Patient seen coming out of bathroom by this RN, asked patient if he was able to get a urine specimen while he was in there.  Patient responds, "Oh no, my stomach hurting so bad".   Informed patient that we need one when he can go again.  Patient requesting something to drink. Informed patient that they have added a CT scan of abd on him and he will need to wait to eat or drink til after his results are back.

## 2017-02-10 NOTE — ED Notes (Signed)
Attempted stick for blood work, but unsuccessful.

## 2017-02-10 NOTE — Discharge Instructions (Signed)
See your Physician for recheck.  Return if any problems.  °

## 2017-02-10 NOTE — ED Provider Notes (Signed)
[facility]-EMERGENCY DEPT Provider Note   CSN: [id number] Arrival date & time: 02/10/17  0213     History   Chief Complaint Chief Complaint  Patient presents with  . Abdominal Pain    HPI Blake Kipp LaurenceShive is a 41 y.o. male.   Chest Pain   This is a new problem. The current episode started 2 days ago. The problem occurs constantly. The problem has been rapidly worsening. The pain is present in the substernal region. The pain radiates to the epigastrium. Associated symptoms include abdominal pain. He has tried nothing for the symptoms. The treatment provided no relief. Risk factors include substance abuse.  His past medical history is significant for aortic aneurysm.  His family medical history is significant for CAD.  Pt admits using cocaine 2 days ago.  Pt reports he has felt bad since using.    Past Medical History:  Diagnosis Date  . CKD (chronic kidney disease), stage III   . Cocaine abuse    Started using after divorce; no use around time of MI  . Crohn's colitis (HCC)    Currently denies  . Hepatitis C    01/17/2012 labs  . HOCM (hypertrophic obstructive cardiomyopathy) (HCC)   . Hypertension    Multiple Hospitalizations  . Kidney stones   . Myocardial infarction (HCC)    [place]  . Type 2 dissection of thoracic [facility](HCC)     Patient Active Problem List   Diagnosis Date Noted  . Left arm swelling 12/23/2016  . Left arm numbness 12/23/2016  . Ulnar neuropathy of left upper extremity   . Hypertensive emergency   . Malignant hypertension   . Hypertensive urgency, malignant 12/04/2016  . Hypertensive urgency 09/06/2016  . Elevated troponin 09/06/2016  . 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 Hepatitis C 09/06/2016  . Hypertrophic cardiomyopathy (HCC) 09/06/2016  . Gastritis, acute 01/26/2012  . Thrombocytopenia (HCC) 01/24/2012  . Cocaine abuse   . Coronary artery disease due to  lipid rich plaque   . Hypertension     Past Surgical History:  Procedure Laterality Date  . CARDIAC CATHETERIZATION  2011  . CORONARY ANGIOPLASTY WITH STENT PLACEMENT  2011  . LITHOTRIPSY    . OTHER SURGICAL HISTORY  2011   Percutaneous biliary tube  . RENAL BIOPSY  2000  . SMALL INTESTINE SURGERY    . THORACIC AORTIC ANEURYSM REPAIR  12/2015   [facility]       Home Medications    Prior to Admission medications   Medication Sig Start Date End Date Taking? Authorizing Provider  acetaminophen (TYLENOL) 500 MG tablet Take 500 mg by mouth every 6 (six) hours as needed (pain).   Yes [provider]  amLODipine (NORVASC) 10 MG tablet Take 10 mg by mouth daily.    Yes [provider]  aspirin 325 MG tablet Take 325 mg by mouth every morning.    Yes [provider]  cloNIDine (CATAPRES) 0.3 MG tablet Take 0.3 mg by mouth 2 (two) times daily.   Yes [provider]  labetalol (NORMODYNE) 200 MG tablet Take 200 mg by mouth 3 (three) times daily.    Yes [provider]  Multiple Vitamin (MULTIVITAMIN WITH MINERALS) TABS tablet Take 1 tablet by mouth every morning.    Yes [provider]  clindamycin (CLEOCIN) 150 MG capsule Take 2 capsules (300 mg total) by mouth 4 (four) times  daily. Patient not taking: Reported on 12/22/2016 12/16/16   Ward, Chase Picket, PA-C  nitroGLYCERIN 0.2 mg/mL infusion Inject 0-200 mcg/min into the vein continuous. 10/11/16   Tyrone Nine, MD  traMADol (ULTRAM) 50 MG tablet Take 1 tablet (50 mg total) by mouth every 12 (twelve) hours as needed. Patient not taking: Reported on 02/10/2017 12/16/16   Ward, Chase Picket, PA-C    Family History Family History  Problem Relation Age of Onset  . Heart disease Father        early death at age 71  . Diabetes Maternal Aunt   . Cancer Unknown     Social History Social History  Substance Use Topics  . Smoking status: Current Every Day Smoker    Packs/day: 0.50     Types: Cigarettes  . Smokeless tobacco: Never Used  . Alcohol use No     Allergies   Vancomycin and Hydralazine   Review of Systems Review of Systems  Cardiovascular: Positive for chest pain.  Gastrointestinal: Positive for abdominal pain.  All other systems reviewed and are negative.    Physical Exam Updated Vital Signs BP (!) 234/177   Pulse 81   Temp 97.8 F (36.6 C) (Oral)   Resp (!) 22   Ht 6' (1.829 m)   Wt 68 kg (150 lb)   SpO2 99%   BMI 20.34 kg/m   Physical Exam  Constitutional: He appears well-developed and well-nourished.  HENT:  Head: Normocephalic and atraumatic.  Eyes: Conjunctivae are normal.  Neck: Neck supple.  Cardiovascular: Normal rate, regular rhythm and normal heart sounds.   No murmur heard. Pulmonary/Chest: Effort normal and breath sounds normal. No respiratory distress.  Abdominal: Soft. There is no guarding.  Musculoskeletal: He exhibits no edema.  Neurological: He is alert.  Skin: Skin is warm and dry.  Psychiatric: He has a normal mood and affect.  Nursing note and vitals reviewed.    ED Treatments / Results  Labs (all labs ordered are listed, but only abnormal results are displayed) Labs Reviewed  COMPREHENSIVE METABOLIC PANEL - Abnormal; Notable for the following:       Result Value   Glucose, Bld 102 (*)    BUN 34 (*)    Creatinine, Ser 4.11 (*)    Calcium 8.7 (*)    ALT 13 (*)    Alkaline Phosphatase 152 (*)    GFR calc non Af Amer 17 (*)    GFR calc Af Amer 19 (*)    All other components within normal limits  CBC - Abnormal; Notable for the following:    Hemoglobin 12.0 (*)    HCT 37.6 (*)    Platelets 69 (*)    All other components within normal limits  CK TOTAL AND CKMB (NOT AT [facility]) - Abnormal; Notable for the following:    Relative Index 3.5 (*)    All other components within normal limits  LIPASE, BLOOD  URINALYSIS, ROUTINE W REFLEX MICROSCOPIC  I-STAT TROPOININ, ED  POCT I-STAT TROPONIN I  I-STAT  TROPOININ, ED    EKG  EKG Interpretation  Date/Time:  Tuesday Feb 10 2017 02:44:52 EDT Ventricular Rate:  74 PR Interval:    QRS Duration: 111 QT Interval:  437 QTC Calculation: 485 R Axis:   -17 Text Interpretation:  Sinus rhythm Left atrial enlargement LVH with IVCD and secondary repol abnrm Confirmed by Nicanor Alcon, April ([id number]) on 02/10/2017 2:48:47 AM Also confirmed by Nicanor Alcon, April ([id number]), editor Rollen Sox [phone])  on 02/10/2017 7:33:21  AM       Radiology Dg Chest 2 View  Result Date: 02/10/2017 CLINICAL DATA:  Shortness of breath, RIGHT lower chest pain for 3 days. History of type 2 thoracic aortic dissection. EXAM: CHEST  2 VIEW COMPARISON:  CT chest December 22, 2016 FINDINGS: The cardiac silhouette is mildly enlarged. Status post descending aortic stent graft. No pleural effusion or focal consolidation. RIGHT granulomas. RIGHT lung base scarring. No pneumothorax. Soft tissue planes and included osseous structures are nonsuspicious. IMPRESSION: Mild cardiomegaly, status post descending thoracic aorta stent graft. No acute pulmonary process. Electronically Signed   By: Awilda Metro M.D.   On: 02/10/2017 03:03   Ct Chest Wo Contrast  Result Date: 02/10/2017 CLINICAL DATA:  41 year old male with aortic dissection treated with endograft. Right side chest pain. Noncontrast study due to renal disease. EXAM: CT CHEST WITHOUT CONTRAST TECHNIQUE: Multidetector CT imaging of the chest was performed following the standard protocol without IV contrast. COMPARISON:  Noncontrast chest CT 12/22/2016 and earlier. FINDINGS: Cardiovascular: Stable cardiomegaly. No pericardial effusion. Extensive calcified coronary artery atherosclerosis and/or vascular stents. Vascular patency is not evaluated in the absence of IV contrast. Aortic endograft in place from the level of the subclavian artery to the aortic hiatus. Underlying generalized dolichoectasia of the aorta. Distal arch region fusiform  enlargement up to 57 mm diameter is stable (series 5, image 100 and series 2, image 34). Fusiform enlargement of the aorta at the esophageal hiatus estimated at 48 mm is stable. No periaortic hematoma identified. Mediastinum/Nodes: Widespread small calcified mediastinal and hilar lymph nodes are stable. No mediastinal hematoma identified. Lungs/Pleura: Major airways are patent and stable. Lower lung volumes compared to 12/22/2016. Coarsely calcified numerous bilateral lung nodules affecting all lobes are stable since 10/11/2016 and range from 2-3 mm (superior segment right lower lobe series 3, image 58) up to 9-10 mm diameter (e.g. Left lower lobe series 3, image 115). Trace left pleural effusion is unchanged. No right pleural effusion. No acute pulmonary opacity. Upper Abdomen: Trace perihepatic fluid is stable. Calcified splenic and occasional liver granulomas again noted. Stable azygos vein enlargement under the right crus of the diaphragm. Musculoskeletal: No acute or suspicious osseous lesion identified. IMPRESSION: 1. Stable noncontrast CT appearance of the chest. No acute findings. 2. 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 3. Prior granulomatous disease. Numerous stable coarsely calcified pulmonary nodules. Small calcified mediastinal lymph nodes. Splenic and liver calcified granulomas. 4. Continued trace left pleural effusion and perihepatic free fluid. Electronically Signed   By: Odessa Fleming M.D.   On: 02/10/2017 08:09    Procedures Procedures (including critical care time)  Medications Ordered in ED Medications - No data to display   Initial Impression / Assessment and Plan / ED Course  I have reviewed the triage vital signs and the nursing notes.  Pertinent labs & imaging results that were available during my care of the patient were reviewed  by me and considered in my medical decision making (see chart for details).     Pt counseled on need for follow up due to kidney disease.  Pt advised he needs to take his blood pressure medications.   I advised pt given his history of multi organ/vascular disease that using cocaine could cause his death.  Pt advised to follow up with a primary care MD/ nephrology for assessment.   Final Clinical Impressions(s) /  ED Diagnoses   Final diagnoses:  Chest wall pain    New Prescriptions Discharge Medication List as of 02/10/2017 12:11 PM     Current Meds  Medication Sig  . acetaminophen (TYLENOL) 500 MG tablet Take 500 mg by mouth every 6 (six) hours as needed (pain).  Marland Kitchen amLODipine (NORVASC) 10 MG tablet Take 10 mg by mouth daily.   Marland Kitchen aspirin 325 MG tablet Take 325 mg by mouth every morning.   . cloNIDine (CATAPRES) 0.3 MG tablet Take 0.3 mg by mouth 2 (two) times daily.  Marland Kitchen labetalol (NORMODYNE) 200 MG tablet Take 200 mg by mouth 3 (three) times daily.   . Multiple Vitamin (MULTIVITAMIN WITH MINERALS) TABS tablet Take 1 tablet by mouth every morning.    An After Visit Summary was printed and given to the patient.   Elson Areas, [place] 02/10/17 1343    Maia Plan, MD 02/10/17 [phone]

## 2017-02-10 NOTE — ED Notes (Signed)
Pt cannot urinate. 

## 2017-02-10 NOTE — ED Notes (Signed)
ED Provider at bedside. 

## 2017-07-02 IMAGING — CT CT CHEST W/O CM
2 of 4 series · 15 of 36 positions shown, 18 images · non-contrast
Comparison: Noncontrast chest CT 12/22/2016 and earlier.

CLINICAL DATA: 40 year old male with aortic dissection treated with
endograft. Right side chest pain. Noncontrast study due to renal
disease.

EXAM:
CT CHEST WITHOUT CONTRAST
TECHNIQUE: Multidetector CT imaging of the chest was performed following the
standard protocol without IV contrast.

[Series 2: chest w/o st · axial · non-contrast · 0.71mm/px · z∈[+1452,+1684]mm · 12 of 138 slices shown, 15 images]
[im 11/138  mediastinal]
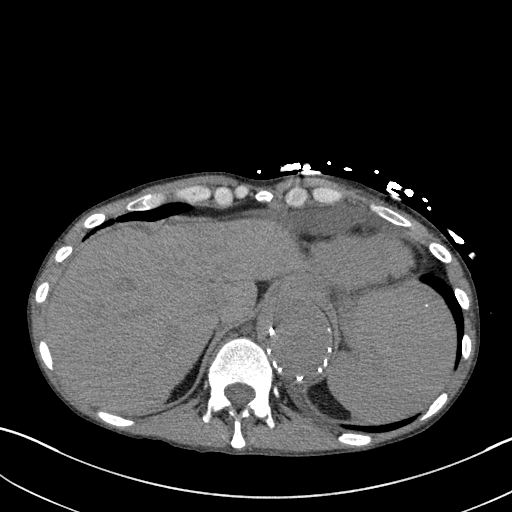
[im 11/138  lung]
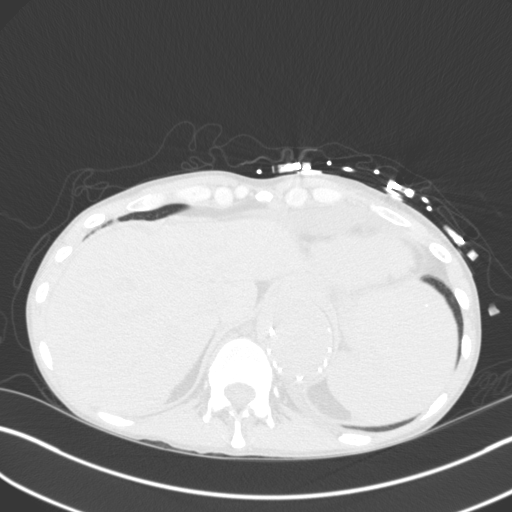
[im 22/138  lung]
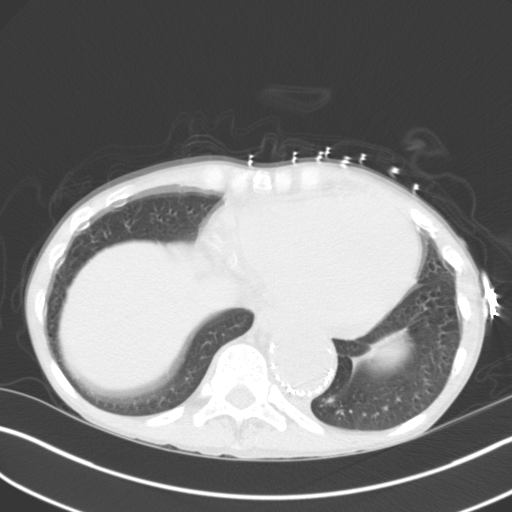
[im 32/138  lung]
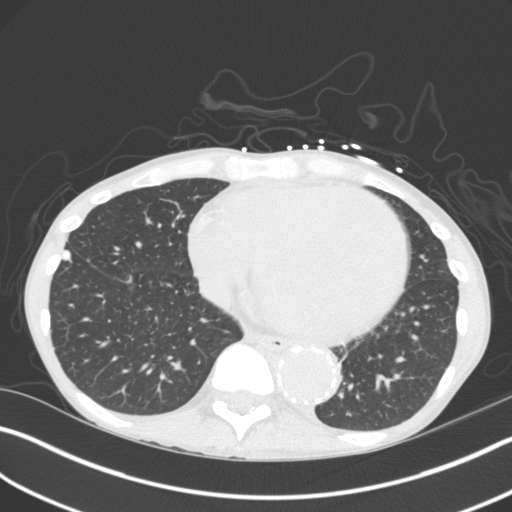
[im 43/138  lung]
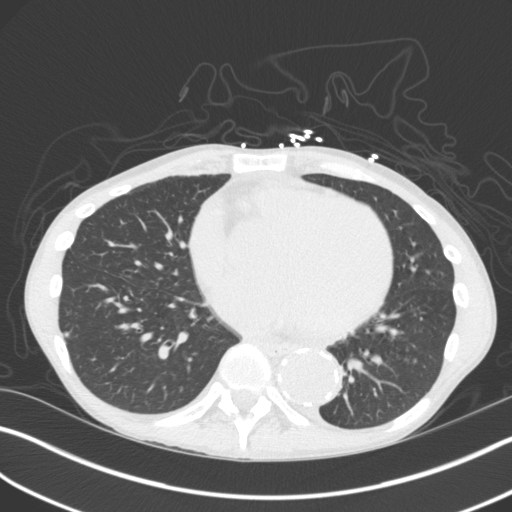
[im 53/138  mediastinal]
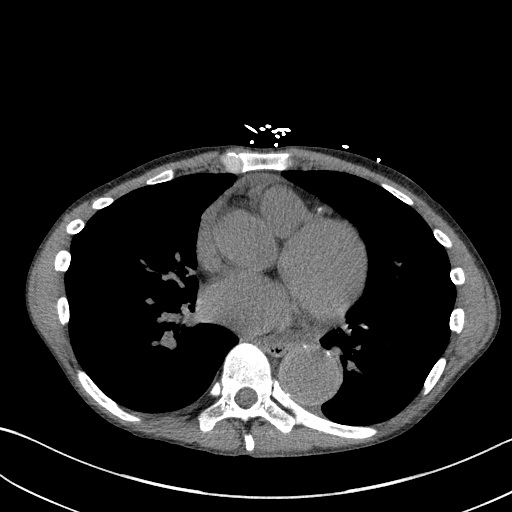
[im 53/138  lung]
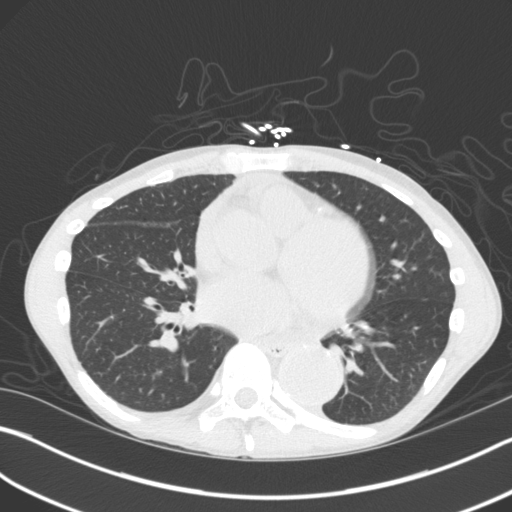
[im 64/138  lung]
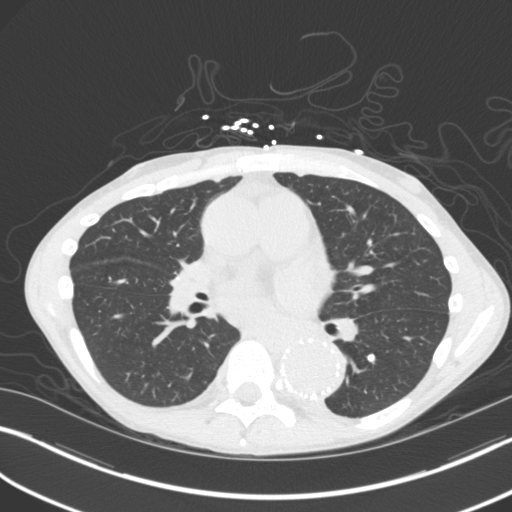
[im 74/138  lung]
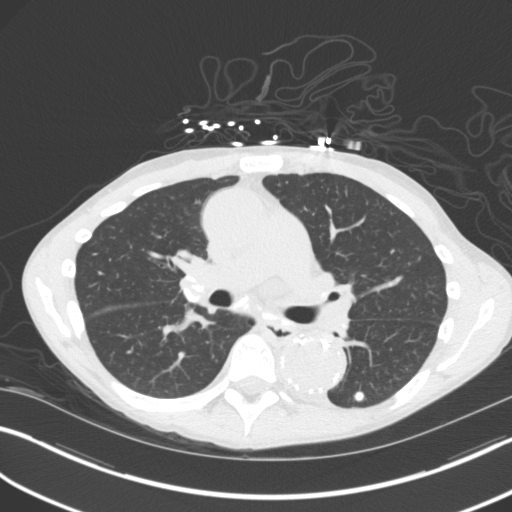
[im 85/138  lung]
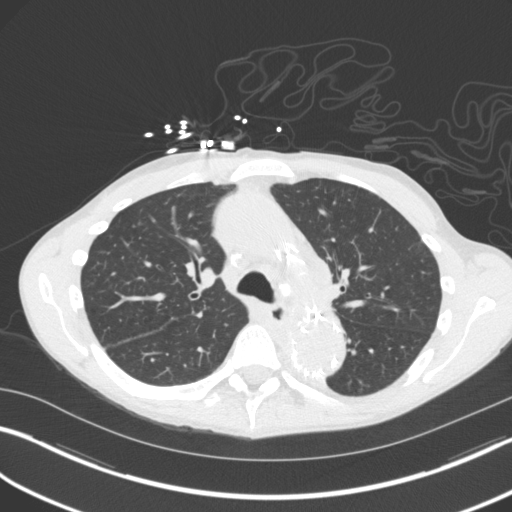
[im 95/138  mediastinal]
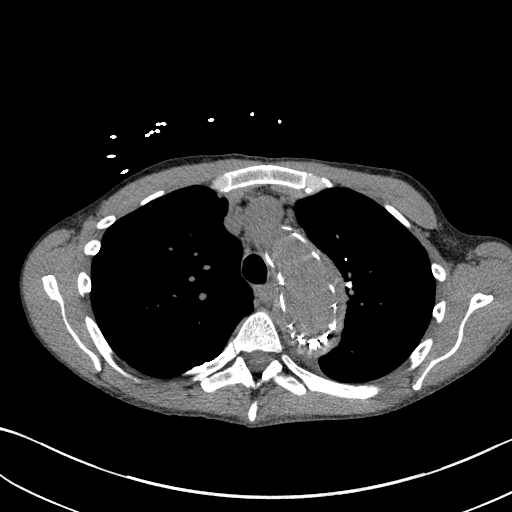
[im 95/138  lung]
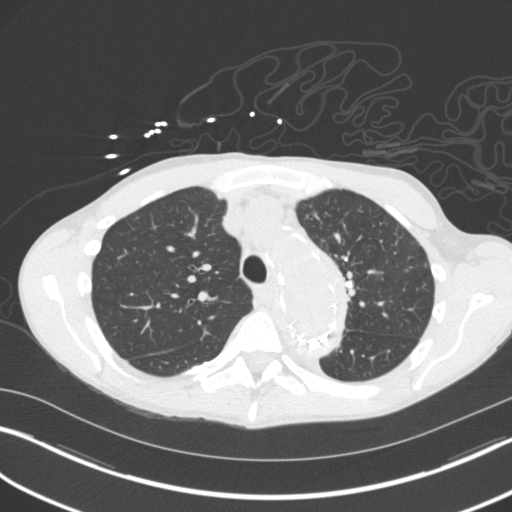
[im 106/138  lung]
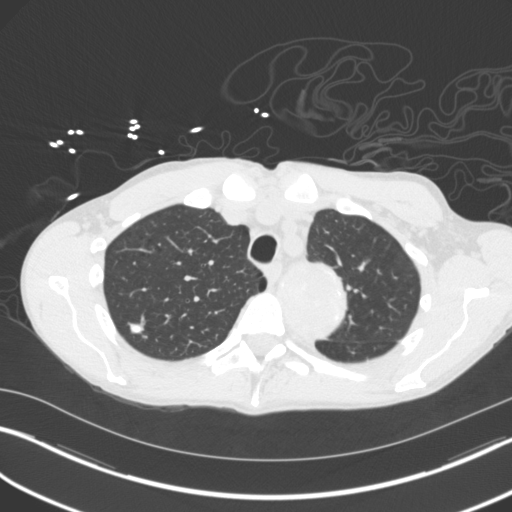
[im 116/138  lung]
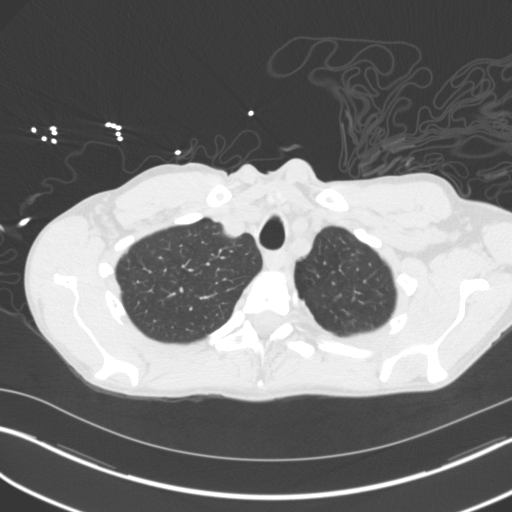
[im 127/138  lung]
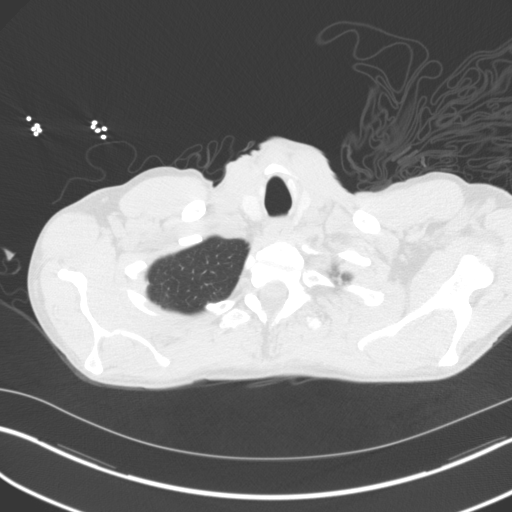

[Series 4: coronals · coronal · 0.56mm/px · 3 of 109 slices shown]
[im 22/109  lung]
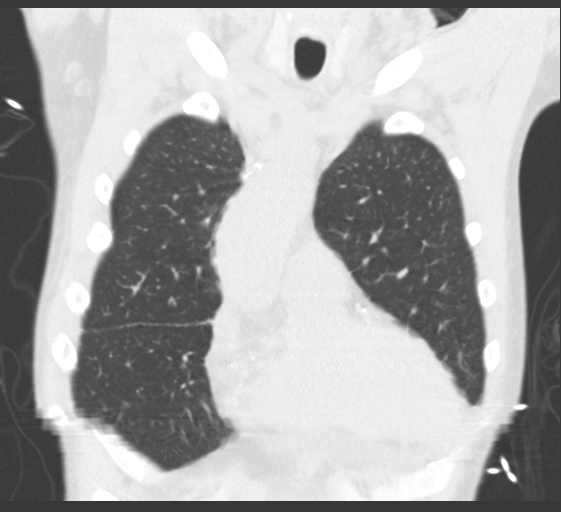
[im 44/109  lung]
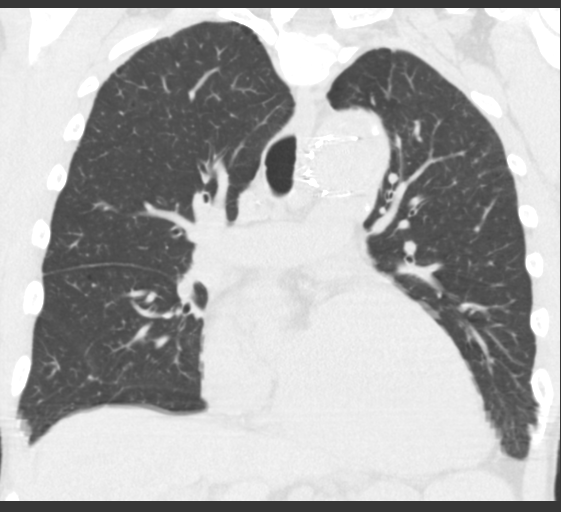
[im 65/109  lung]
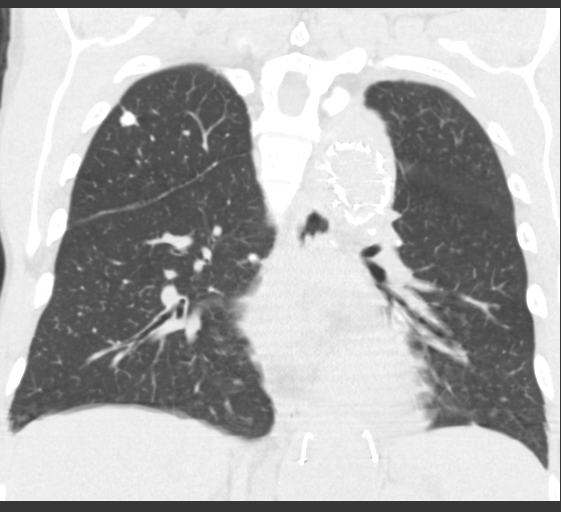

[15 of 36 positions shown; findings below may reference images not displayed]

FINDINGS: Cardiovascular: Stable cardiomegaly. No pericardial effusion.
Extensive calcified coronary artery atherosclerosis and/or vascular
stents.

Vascular patency is not evaluated in the absence of IV contrast.

Aortic endograft in place from the level of the subclavian artery to
the aortic hiatus. Underlying generalized dolichoectasia of the
aorta. Distal arch region fusiform enlargement up to 57 mm diameter
is stable (series 5, image 100 and series 2, image 34). Fusiform
enlargement of the aorta at the esophageal hiatus estimated at 48 mm
is stable. No periaortic hematoma identified.

Mediastinum/Nodes: Widespread small calcified mediastinal and hilar
lymph nodes are stable. No mediastinal hematoma identified.

Lungs/Pleura: Major airways are patent and stable. Lower lung
volumes compared to 12/22/2016. Coarsely calcified numerous
bilateral lung nodules affecting all lobes are stable since
10/11/2016 and range from 2-3 mm (superior segment right lower lobe
series 3, image 58) up to 9-10 mm diameter (e.g. Left lower lobe
series 3, image 115). Trace left pleural effusion is unchanged. No
right pleural effusion. No acute pulmonary opacity.

Upper Abdomen: Trace perihepatic fluid is stable. Calcified splenic
and occasional liver granulomas again noted. Stable azygos vein
enlargement under the right crus of the diaphragm.

Musculoskeletal: No acute or suspicious osseous lesion identified.
IMPRESSION: 1. Stable noncontrast CT appearance of the chest. No acute findings.
2. Stable noncontrast appearance of abnormal thoracic aorta status
post endograft and with underlying generalized dolichoectasia plus a
focal fusiform aneurysm or pseudoaneurysm in the distal arch.
3. Prior granulomatous disease. Numerous stable coarsely calcified
pulmonary nodules. Small calcified mediastinal lymph nodes. Splenic
and liver calcified granulomas.
4. Continued trace left pleural effusion and perihepatic free fluid.

## 2017-07-02 IMAGING — CR DG CHEST 2V
2 series · 2 of 2 positions shown · non-contrast
Comparison: CT chest December 22, 2016

CLINICAL DATA: Shortness of breath, RIGHT lower chest pain for 3
days. History of type 2 thoracic aortic dissection.

EXAM:
CHEST  2 VIEW

[w chest pa]
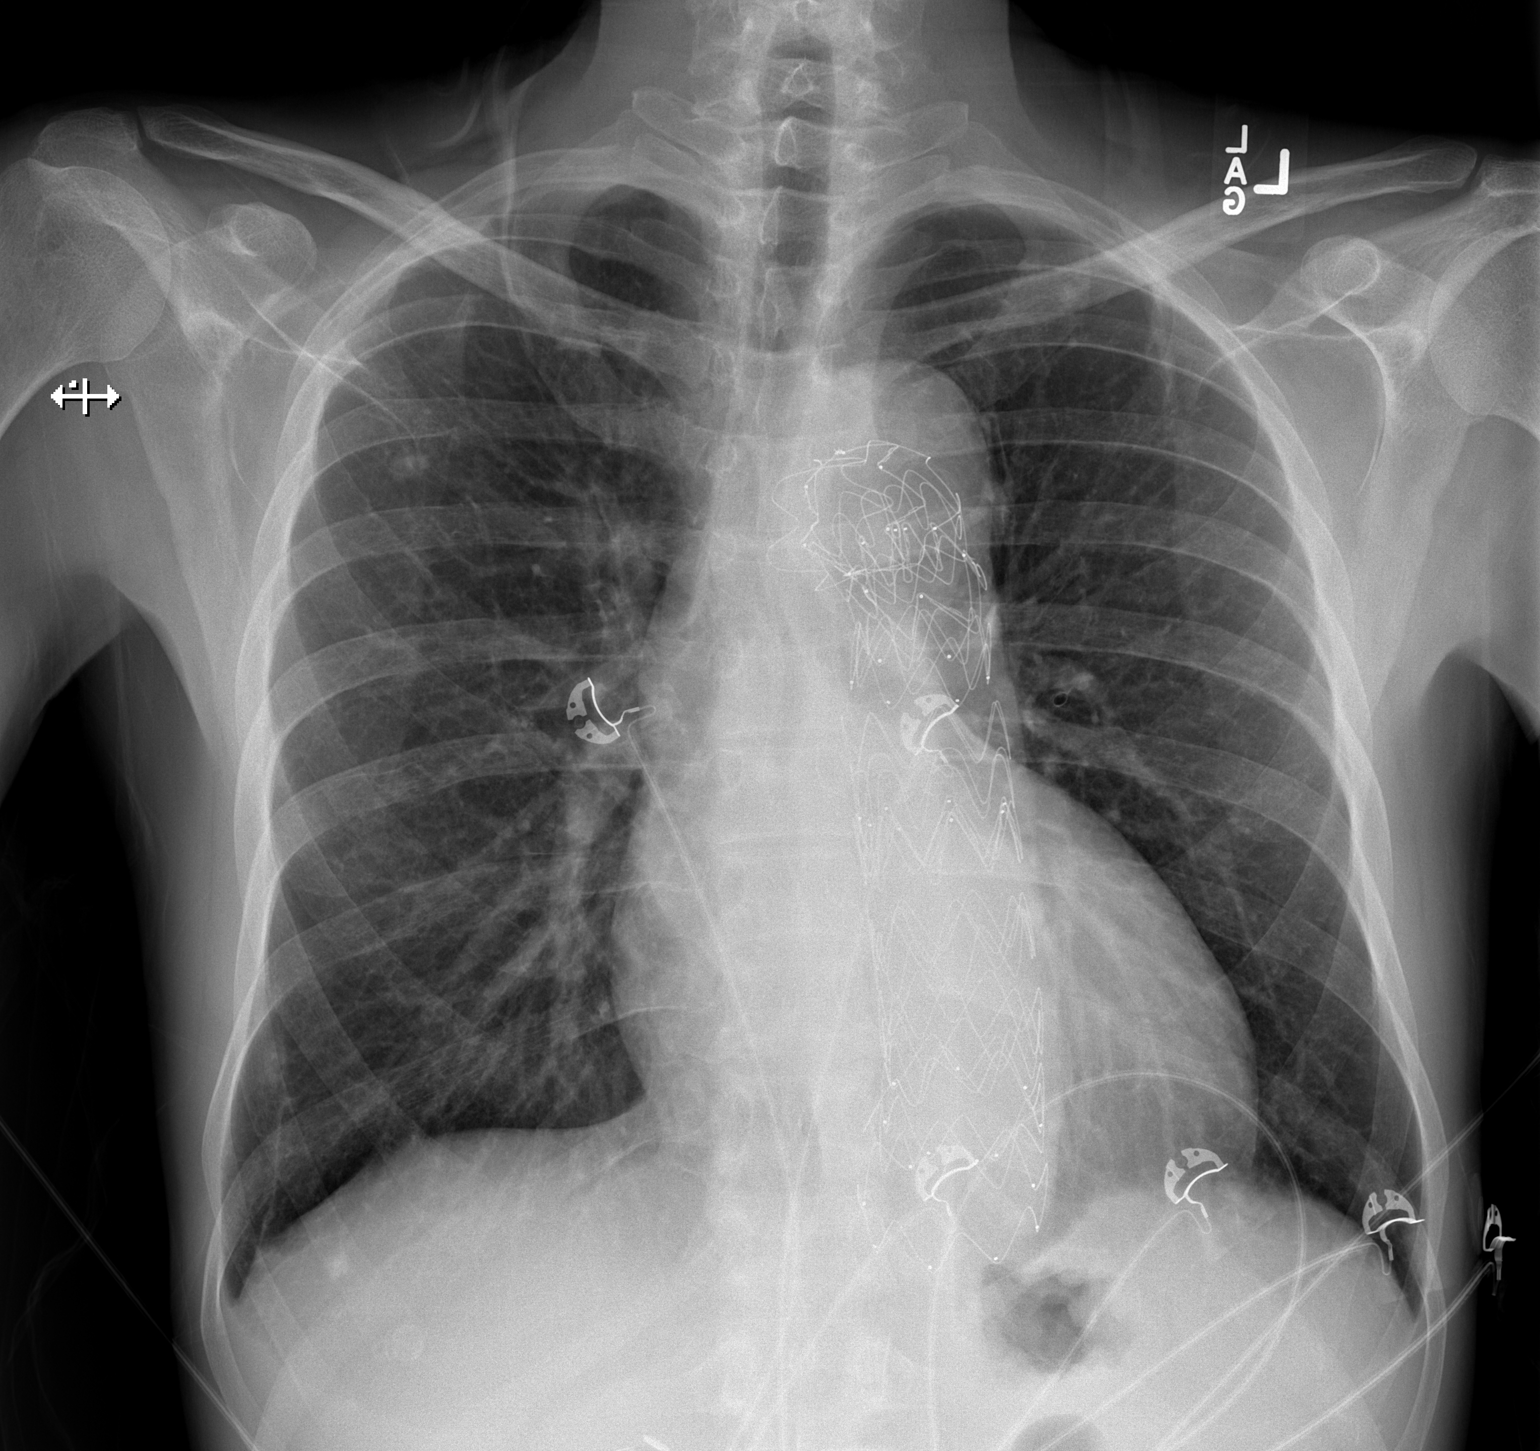

[w chest lat]
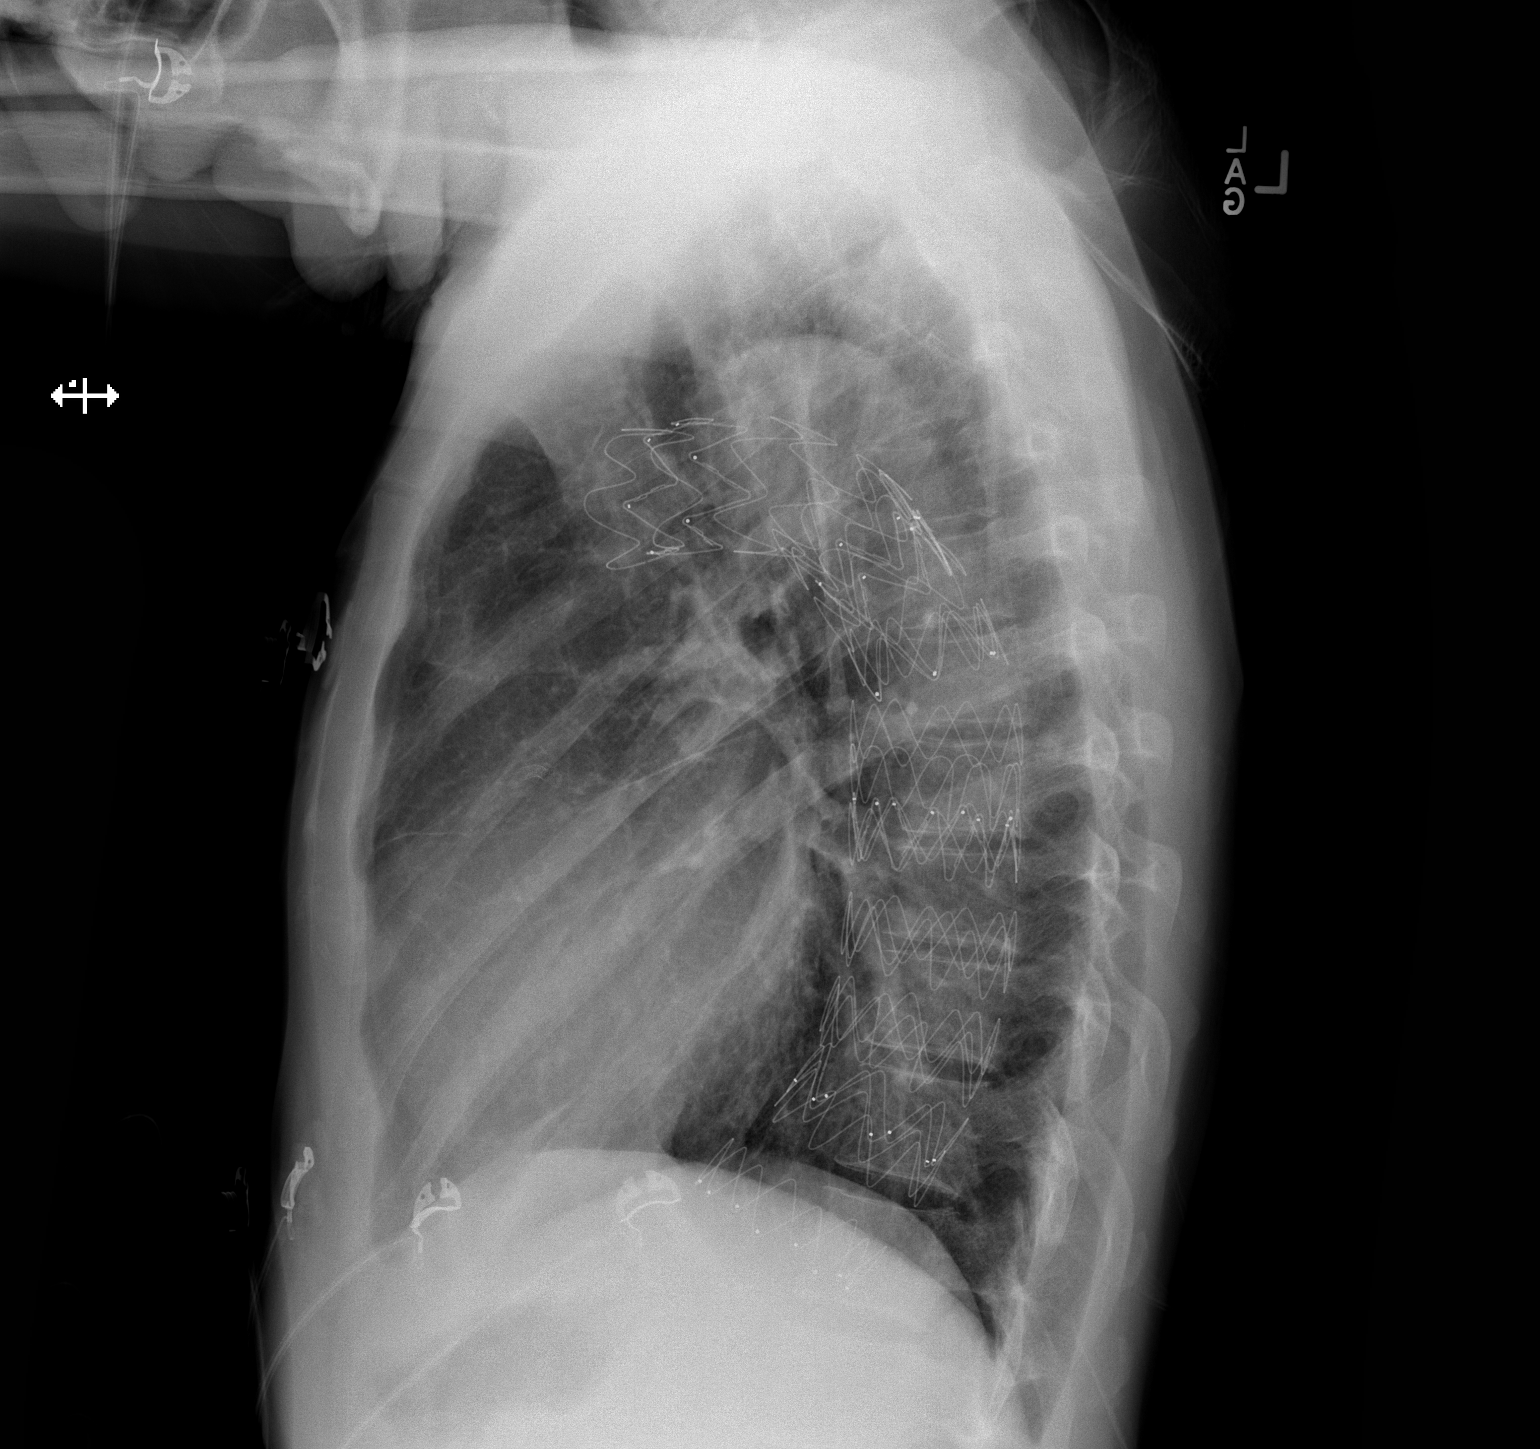

[2 of 2 positions shown; findings below may reference images not displayed]

FINDINGS: The cardiac silhouette is mildly enlarged. Status post descending
aortic stent graft. No pleural effusion or focal consolidation.
RIGHT granulomas. RIGHT lung base scarring. No pneumothorax. Soft
tissue planes and included osseous structures are nonsuspicious.
IMPRESSION: Mild cardiomegaly, status post descending thoracic aorta stent
graft.

No acute pulmonary process.

## 2019-05-17 DEATH — deceased

## 2019-06-16 DEATH — deceased
